# Patient Record
Sex: Female | Born: 1960 | Race: White | Hispanic: No | Marital: Married | Smoking: Never smoker
Health system: Southern US, Community
[De-identification: ages and names within clinical notes are randomized; demographics above are authoritative.]

## PROBLEM LIST (undated history)

## (undated) DIAGNOSIS — E079 Disorder of thyroid, unspecified: Secondary | ICD-10-CM

## (undated) DIAGNOSIS — E785 Hyperlipidemia, unspecified: Secondary | ICD-10-CM

## (undated) DIAGNOSIS — F32A Depression, unspecified: Secondary | ICD-10-CM

## (undated) DIAGNOSIS — F329 Major depressive disorder, single episode, unspecified: Secondary | ICD-10-CM

## (undated) DIAGNOSIS — T7840XA Allergy, unspecified, initial encounter: Secondary | ICD-10-CM

## (undated) HISTORY — DX: Depression, unspecified: F32.A

## (undated) HISTORY — DX: Allergy, unspecified, initial encounter: T78.40XA

## (undated) HISTORY — DX: Disorder of thyroid, unspecified: E07.9

## (undated) HISTORY — DX: Hyperlipidemia, unspecified: E78.5

## (undated) HISTORY — DX: Major depressive disorder, single episode, unspecified: F32.9

---

## 2004-07-15 DIAGNOSIS — F319 Bipolar disorder, unspecified: Secondary | ICD-10-CM | POA: Insufficient documentation

## 2004-09-23 ENCOUNTER — Other Ambulatory Visit: Payer: Self-pay

## 2004-09-23 ENCOUNTER — Emergency Department: Payer: Self-pay | Admitting: General Practice

## 2005-06-23 ENCOUNTER — Emergency Department: Payer: Self-pay | Admitting: Emergency Medicine

## 2005-09-29 ENCOUNTER — Ambulatory Visit: Payer: Self-pay | Admitting: Family Medicine

## 2006-07-20 ENCOUNTER — Inpatient Hospital Stay: Payer: Self-pay | Admitting: Unknown Physician Specialty

## 2008-04-24 ENCOUNTER — Ambulatory Visit: Payer: Self-pay | Admitting: Family Medicine

## 2009-01-24 DIAGNOSIS — E559 Vitamin D deficiency, unspecified: Secondary | ICD-10-CM | POA: Insufficient documentation

## 2009-01-30 DIAGNOSIS — E039 Hypothyroidism, unspecified: Secondary | ICD-10-CM | POA: Insufficient documentation

## 2012-03-23 LAB — HM PAP SMEAR: HM Pap smear: NEGATIVE

## 2012-05-04 ENCOUNTER — Ambulatory Visit: Payer: Self-pay | Admitting: Family Medicine

## 2012-05-17 ENCOUNTER — Ambulatory Visit: Payer: Self-pay | Admitting: Family Medicine

## 2012-07-05 IMAGING — MG MAM DGTL SCRN MAM NO ORDER W/CAD
1 series · 4 of 4 positions shown · non-contrast
Comparison: none

REASON FOR EXAM: scr mammo no order
COMMENTS:

[Series 8184: R CC · right · 4 of 4 slices shown]
[im 1/4]
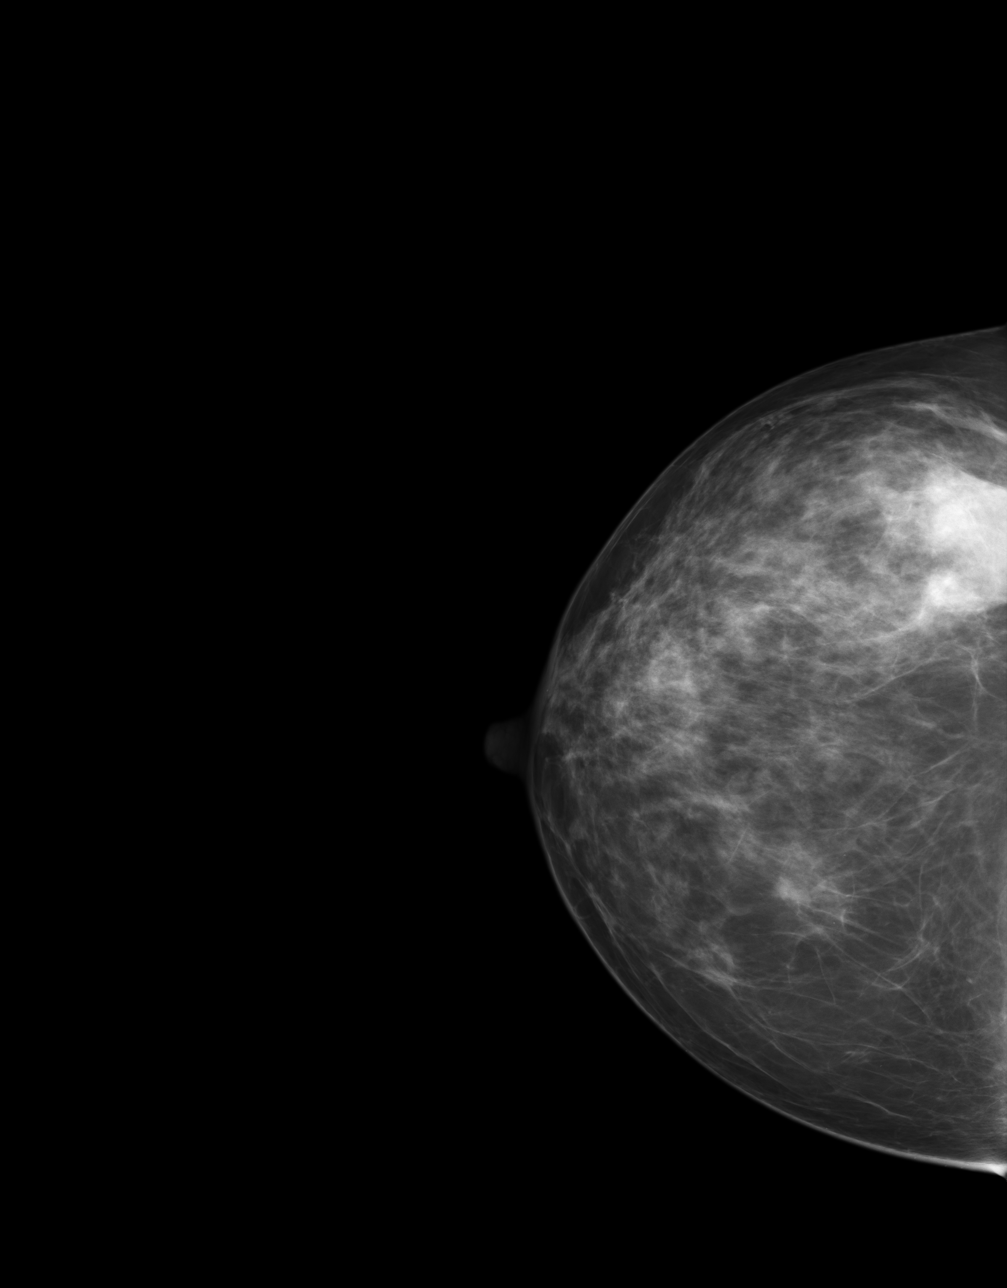
[im 2/4]
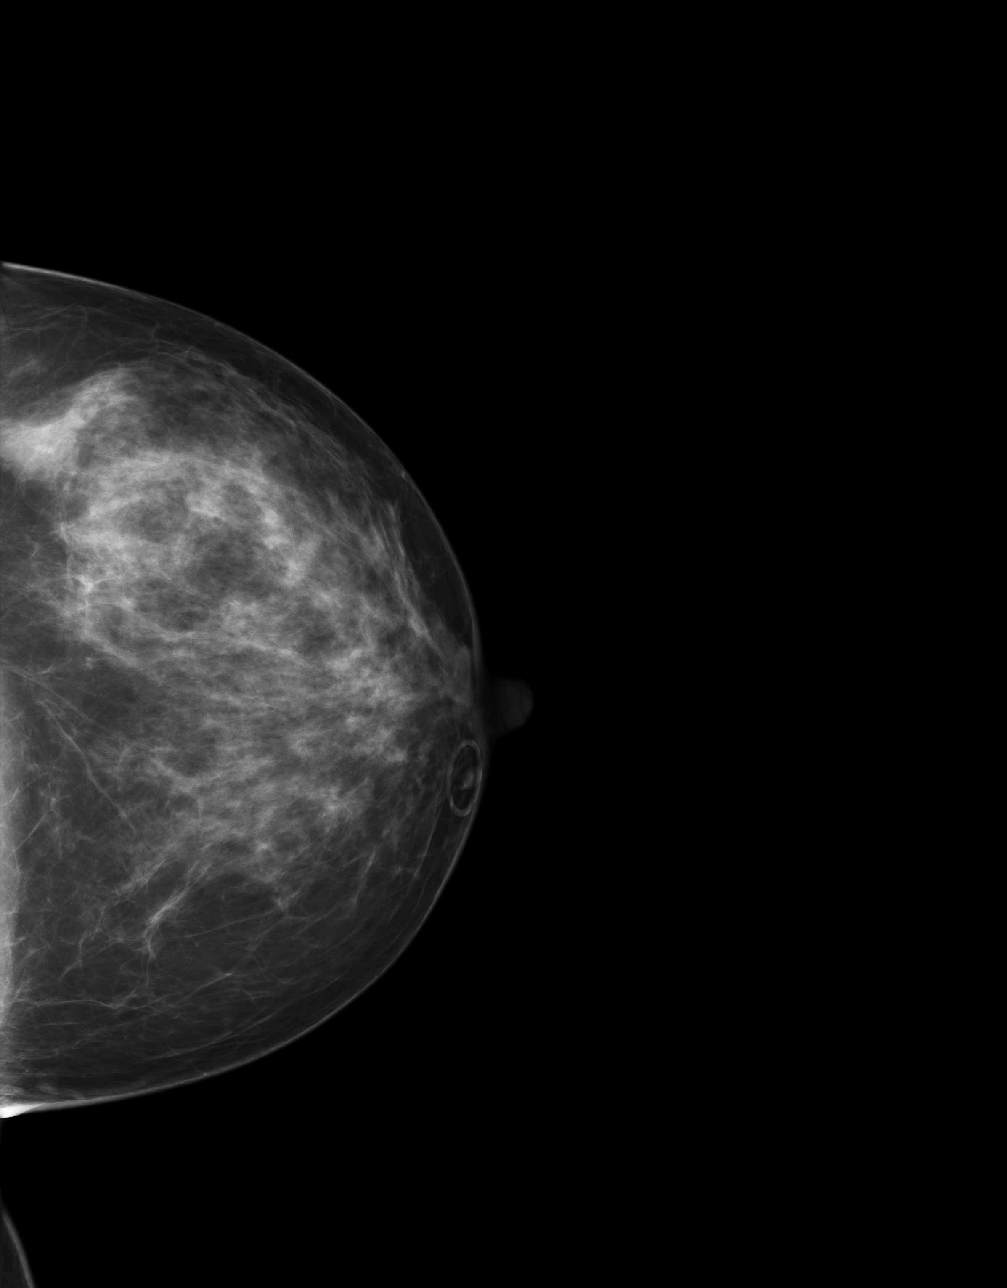
[im 3/4]
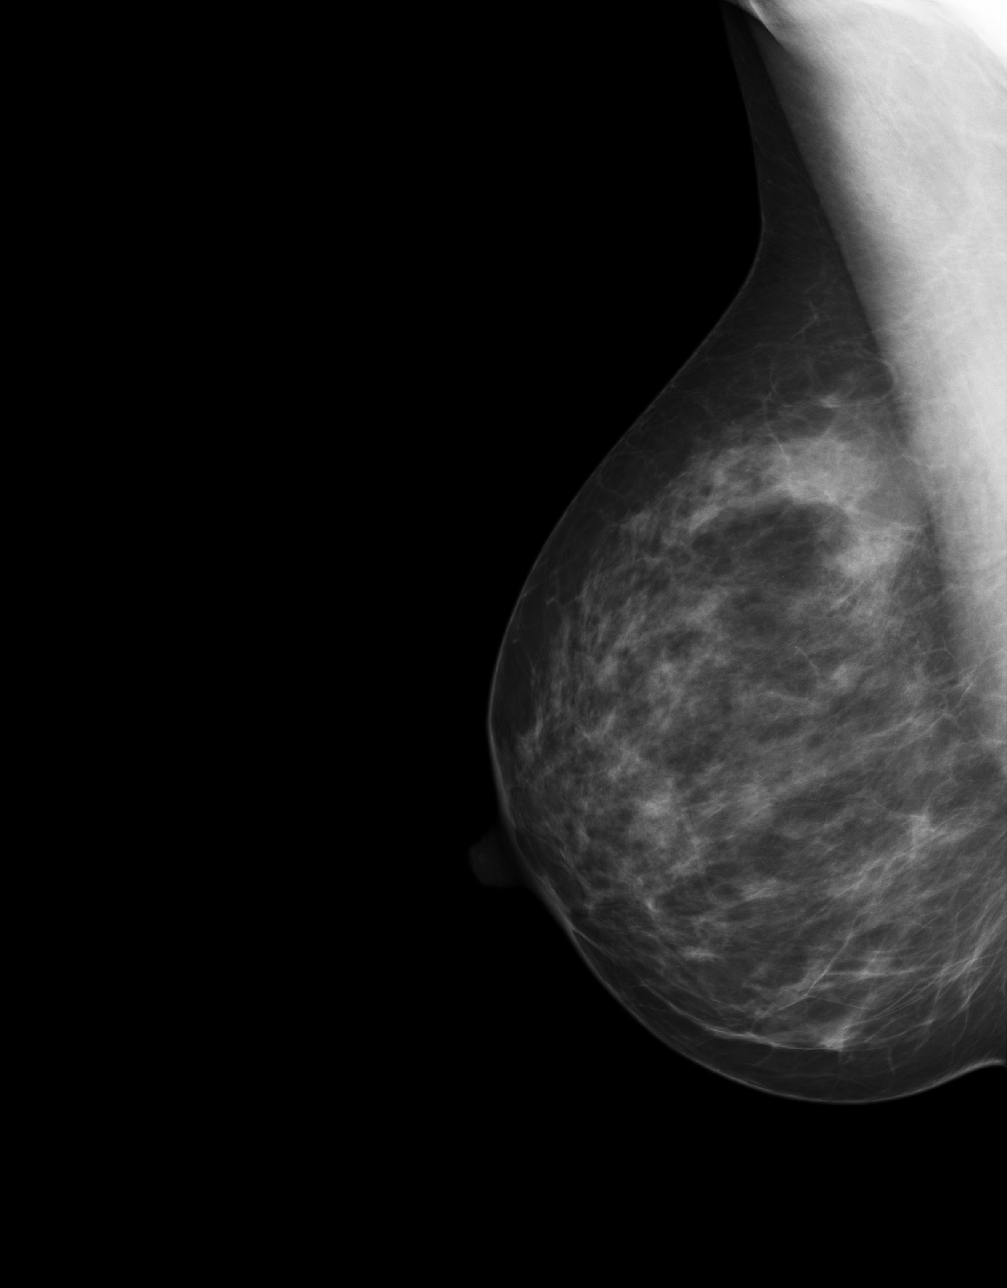
[im 4/4]
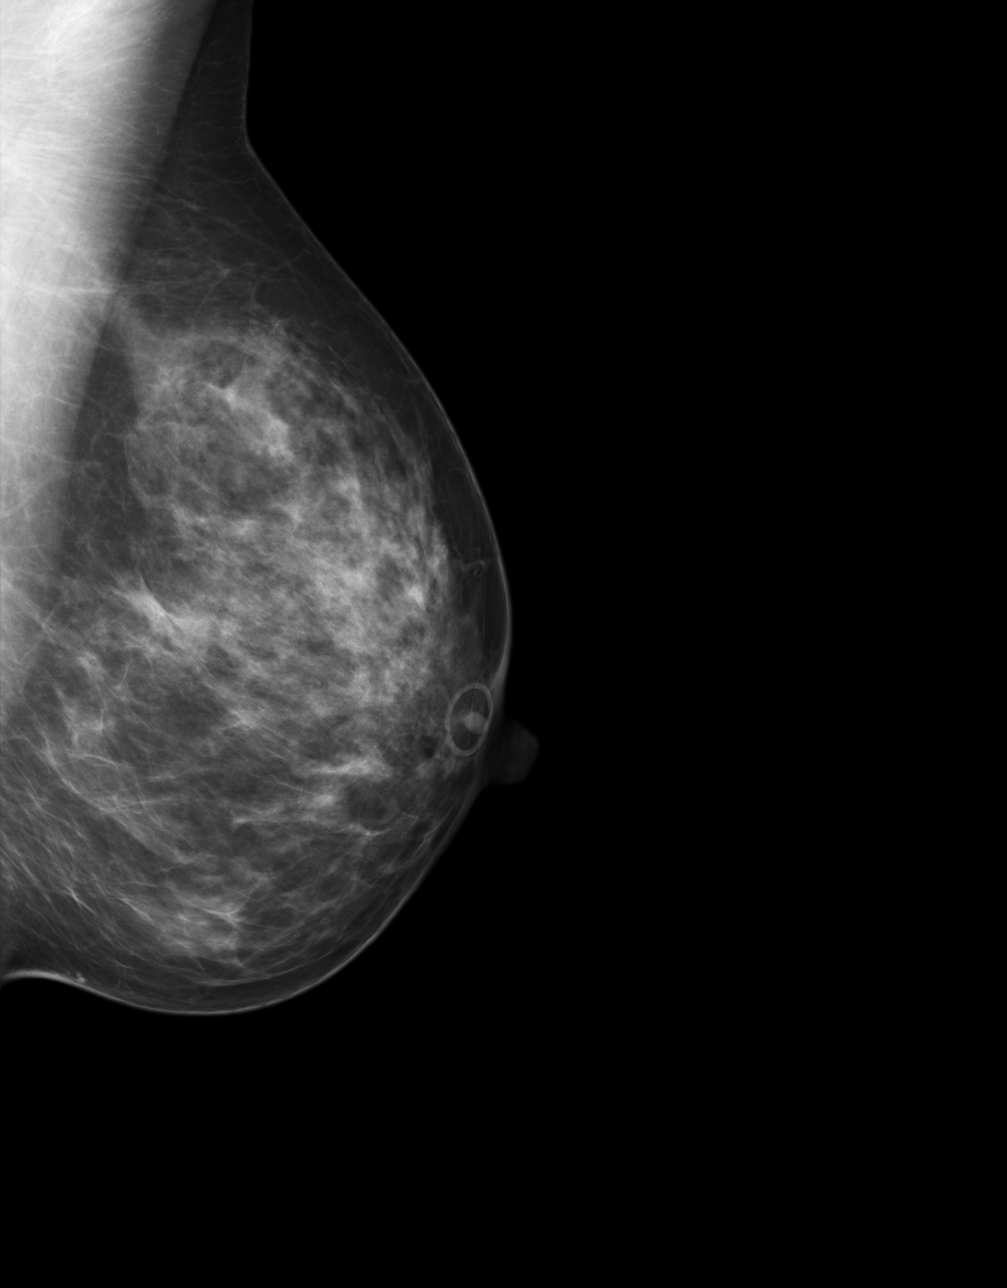

[4 of 4 positions shown; findings below may reference images not displayed]

PROCEDURE:     MAM - MAM DGTL SCRN MAM NO ORDER W/CAD  - May 04, 2012  [DATE]

RESULT:     There is a family history of breast cancer in the patient's
mother. The patient has no previous breast surgery. Comparison is made to
digital mammographic images 24 April, 2008, as well as 29 September, 2005 and
16 July, 2004. There is a moderately dense to dense heterogeneous
parenchymal pattern. There appears to be increasing parenchymal density in
the upper outer posterior right breast. Additional magnification compression
images of the area are necessary for further investigation. Ultrasound may
be required for complete evaluation. Otherwise, the pattern appears to be
grossly normal. No malignant calcification or architectural distortion is
seen.
IMPRESSION: BI-RADS: Category 0 - Needs Addtional Imaging Evaluation

Please have the patient return for additional magnification compression
images in the upper outer posterior right breast. The area is marked and
saved electronically in the digital archive.

A NEGATIVE MAMMOGRAM REPORT DOES NOT PRECLUDE BIOPSY OR OTHER EVALUATION OF
A CLINICALLY PALPABLE OR OTHERWISE SUSPICIOUS MASS OR LESION. BREAST CANCER
MAY NOT BE DETECTED BY MAMMOGRAPHY IN UP TO 10% OF CASES.

[REDACTED]

## 2014-09-19 LAB — HM MAMMOGRAPHY

## 2014-12-05 LAB — LIPID PANEL
CHOLESTEROL: 263 mg/dL — AB (ref 0–200)
HDL: 90 mg/dL — AB (ref 35–70)
LDL Cholesterol: 158 mg/dL
TRIGLYCERIDES: 73 mg/dL (ref 40–160)

## 2014-12-05 LAB — BASIC METABOLIC PANEL
BUN: 15 mg/dL (ref 4–21)
CREATININE: 1 mg/dL (ref 0.5–1.1)
Glucose: 86 mg/dL
Potassium: 4.5 mmol/L (ref 3.4–5.3)
Sodium: 141 mmol/L (ref 137–147)

## 2014-12-05 LAB — HEPATIC FUNCTION PANEL
ALT: 9 U/L (ref 7–35)
AST: 18 U/L (ref 13–35)

## 2014-12-05 LAB — CBC AND DIFFERENTIAL
HEMATOCRIT: 43 % (ref 36–46)
HEMOGLOBIN: 14.4 g/dL (ref 12.0–16.0)
Platelets: 235 10*3/uL (ref 150–399)
WBC: 4.4 10*3/mL

## 2014-12-05 LAB — TSH: TSH: 5.9 u[IU]/mL (ref 0.41–5.90)

## 2015-02-05 ENCOUNTER — Ambulatory Visit: Payer: Self-pay | Admitting: Gastroenterology

## 2015-02-05 LAB — HM COLONOSCOPY: HM Colonoscopy: 1

## 2015-02-07 ENCOUNTER — Ambulatory Visit: Payer: Self-pay | Admitting: Gastroenterology

## 2015-03-18 LAB — SURGICAL PATHOLOGY

## 2015-07-12 DIAGNOSIS — M2550 Pain in unspecified joint: Secondary | ICD-10-CM | POA: Insufficient documentation

## 2015-07-12 DIAGNOSIS — F32A Depression, unspecified: Secondary | ICD-10-CM | POA: Insufficient documentation

## 2015-07-12 DIAGNOSIS — E871 Hypo-osmolality and hyponatremia: Secondary | ICD-10-CM | POA: Insufficient documentation

## 2015-07-12 DIAGNOSIS — F329 Major depressive disorder, single episode, unspecified: Secondary | ICD-10-CM | POA: Insufficient documentation

## 2015-07-12 DIAGNOSIS — E785 Hyperlipidemia, unspecified: Secondary | ICD-10-CM | POA: Insufficient documentation

## 2015-07-12 DIAGNOSIS — B351 Tinea unguium: Secondary | ICD-10-CM | POA: Insufficient documentation

## 2015-07-12 DIAGNOSIS — H6120 Impacted cerumen, unspecified ear: Secondary | ICD-10-CM | POA: Insufficient documentation

## 2015-07-12 DIAGNOSIS — J309 Allergic rhinitis, unspecified: Secondary | ICD-10-CM | POA: Insufficient documentation

## 2015-07-12 DIAGNOSIS — G44209 Tension-type headache, unspecified, not intractable: Secondary | ICD-10-CM | POA: Insufficient documentation

## 2015-07-22 ENCOUNTER — Encounter: Payer: Self-pay | Admitting: Family Medicine

## 2015-07-22 ENCOUNTER — Ambulatory Visit (INDEPENDENT_AMBULATORY_CARE_PROVIDER_SITE_OTHER): Payer: BLUE CROSS/BLUE SHIELD | Admitting: Family Medicine

## 2015-07-22 VITALS — BP 118/76 | HR 88 | Temp 97.9°F | Resp 16 | Ht 67.5 in | Wt 143.0 lb

## 2015-07-22 DIAGNOSIS — R202 Paresthesia of skin: Secondary | ICD-10-CM | POA: Diagnosis not present

## 2015-07-22 DIAGNOSIS — E038 Other specified hypothyroidism: Secondary | ICD-10-CM

## 2015-07-22 DIAGNOSIS — E871 Hypo-osmolality and hyponatremia: Secondary | ICD-10-CM | POA: Diagnosis not present

## 2015-07-22 NOTE — Progress Notes (Signed)
Subjective:    Patient ID: Sherry Hammond, female    DOB: 11/15/1961, 54 y.o.   MRN: [id number]  HPI  Joint/Muscle Pain: Patient complains of myalgias for which has been present for several years. Pain is located in multiple joints and multiple muscles, is described as tingling, and is intermittent .  Associated symptoms include: decreased range of motion.  The patient has tried Tylenol and Advil.  Related to injury:   Pt is a former gymnast, and believes the aches and pains are secondary to that. Pt has noticed tingling in her legs for the last 6-12 months.  Has looked up fibromyalgia.   Does not see the hypomania. Does not think she has mania. Think she does not have it. Does have tingles all over her body. Is working with psychologist and psychiatrist. Has been on and off the Lithium. Did have some twitching. Was more level but it was too much.  Not sleeping well. Wakes and can not fall back to sleep. Starts thinking about things.  No grandiosity. Does have a lot of ADD characteristics.  Feeds her adrenaline in up.     Review of Systems  Constitutional: Positive for fatigue. Negative for fever, chills, diaphoresis, activity change, appetite change and unexpected weight change.  Endocrine: Positive for cold intolerance.  Neurological: Positive for weakness and numbness.  Psychiatric/Behavioral: Positive for sleep disturbance and decreased concentration. The patient is nervous/anxious.    BP 118/76 mmHg  Pulse 88  Temp(Src) 97.9 F (36.6 C) (Oral)  Resp 16  Ht 5' 7.5" (1.715 m)  Wt 143 lb (64.864 kg)  BMI 22.05 kg/m2   Patient Active Problem List   Diagnosis Date Noted  . Allergic rhinitis 07/12/2015  . Ache in joint 07/12/2015  . Cerumen impaction 07/12/2015  . Clinical depression 07/12/2015  . Hypercholesteremia 07/12/2015  . Hypo-osmolality and hyponatremia 07/12/2015  . Fungal infection of nail 07/12/2015  . Headache, tension-type 07/12/2015  . Adult hypothyroidism  01/30/2009  . Avitaminosis D 01/24/2009  . 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 Thyroid disease   . Depression   . Hyperlipidemia   . Allergy    Current Outpatient Prescriptions on File Prior to Visit  Medication Sig  . Cholecalciferol 1000 UNITS capsule Take 2 capsules by mouth daily.  Marland Kitchen levothyroxine (SYNTHROID) 88 MCG tablet Take 1 tablet by mouth daily.  Marland Kitchen zolpidem (AMBIEN) 10 MG tablet Take 1 tablet by mouth daily.   No current facility-administered medications on file prior to visit.   No Known Allergies Past Surgical History  Procedure Laterality Date  . Cesarean section     Social History   Social History  . Marital Status: Married    Spouse Name: N/A  . Number of Children: N/A  . Years of Education: N/A   Occupational History  . Not on file.   Social History Main Topics  . Smoking status: Never Smoker   . Smokeless tobacco: Never Used  . Alcohol Use: Yes     Comment: occasional  . Drug Use: No  . Sexual Activity: Yes   Other Topics Concern  . Not on file   Social History Narrative   Family History  Problem Relation Age of Onset  . Hyperlipidemia Mother   . Hypertension Mother   . Cancer Mother     BREAST  . Hyperlipidemia Father   . Hypertension Father   . Kidney disease Father   . Healthy Sister   .  Healthy Brother   . Healthy Sister       Objective:   Physical Exam  Constitutional: She is oriented to person, place, and time. She appears well-developed and well-nourished.  Neurological: She is alert and oriented to person, place, and time.  Psychiatric: She has a normal mood and affect. Her behavior is normal.  Speech a little pressured. Really fixated on dopamine levels.    BP 118/76 mmHg  Pulse 88  Temp(Src) 97.9 F (36.6 C) (Oral)  Resp 16  Ht 5' 7.5" (1.715 m)  Wt 143 lb (64.864 kg)  BMI 22.05 kg/m2     Assessment & Plan:  1. Paresthesia Worsening. Will check labs.  - Vitamin  B12  2. Other specified hypothyroidism Stable.  Will check labs.  - TSH  3. Hypo-osmolality and hyponatremia Stable. Will check labs. - CBC with Differential/Platelet - Comprehensive metabolic panel  Concerned that she does not want to have diagnosis of hypomania. Does not want a mood stabilizer.  Did spend a lot of time talking to patient about diagnosis.   Suspect she is hypomanic. Will follow thru with work up and reconsider medication.    Lorie Phenix, MD

## 2015-07-23 ENCOUNTER — Telehealth: Payer: Self-pay

## 2015-07-23 LAB — COMPREHENSIVE METABOLIC PANEL
ALT: 9 IU/L (ref 0–32)
AST: 17 IU/L (ref 0–40)
Albumin/Globulin Ratio: 1.8 (ref 1.1–2.5)
Albumin: 4.5 g/dL (ref 3.5–5.5)
Alkaline Phosphatase: 51 IU/L (ref 39–117)
BILIRUBIN TOTAL: 0.4 mg/dL (ref 0.0–1.2)
BUN/Creatinine Ratio: 20 (ref 9–23)
BUN: 18 mg/dL (ref 6–24)
CALCIUM: 10.1 mg/dL (ref 8.7–10.2)
CHLORIDE: 101 mmol/L (ref 97–108)
CO2: 24 mmol/L (ref 18–29)
Creatinine, Ser: 0.92 mg/dL (ref 0.57–1.00)
GFR calc non Af Amer: 72 mL/min/{1.73_m2} (ref >59)
GFR, EST AFRICAN AMERICAN: 83 mL/min/{1.73_m2} (ref >59)
GLUCOSE: 99 mg/dL (ref 65–99)
Globulin, Total: 2.5 g/dL (ref 1.5–4.5)
Potassium: 5.2 mmol/L (ref 3.5–5.2)
Sodium: 143 mmol/L (ref 134–144)
TOTAL PROTEIN: 7 g/dL (ref 6.0–8.5)

## 2015-07-23 LAB — CBC WITH DIFFERENTIAL/PLATELET
BASOS ABS: 0.1 10*3/uL (ref 0.0–0.2)
Basos: 1 %
EOS (ABSOLUTE): 0.2 10*3/uL (ref 0.0–0.4)
Eos: 4 %
Hematocrit: 41.5 % (ref 34.0–46.6)
Hemoglobin: 14.1 g/dL (ref 11.1–15.9)
IMMATURE GRANS (ABS): 0 10*3/uL (ref 0.0–0.1)
IMMATURE GRANULOCYTES: 0 %
LYMPHS: 35 %
Lymphocytes Absolute: 1.6 10*3/uL (ref 0.7–3.1)
MCH: 30.1 pg (ref 26.6–33.0)
MCHC: 34 g/dL (ref 31.5–35.7)
MCV: 89 fL (ref 79–97)
Monocytes Absolute: 0.3 10*3/uL (ref 0.1–0.9)
Monocytes: 8 %
NEUTROS PCT: 52 %
Neutrophils Absolute: 2.3 10*3/uL (ref 1.4–7.0)
PLATELETS: 243 10*3/uL (ref 150–379)
RBC: 4.68 x10E6/uL (ref 3.77–5.28)
RDW: 12.8 % (ref 12.3–15.4)
WBC: 4.5 10*3/uL (ref 3.4–10.8)

## 2015-07-23 LAB — TSH: TSH: 1.81 u[IU]/mL (ref 0.450–4.500)

## 2015-07-23 LAB — VITAMIN B12: VITAMIN B 12: 492 pg/mL (ref 211–946)

## 2015-07-23 NOTE — Telephone Encounter (Signed)
-----   Message from Lorie Phenix, MD sent at 07/23/2015  8:28 AM EDT ----- Labs normal. Please notify patient. Thanks.

## 2015-07-23 NOTE — Telephone Encounter (Signed)
Pt advised.   Thanks,   -Jazmyn Offner  

## 2015-07-23 NOTE — Telephone Encounter (Signed)
LMTCB 07/23/2015  Thanks,   -Laura  

## 2015-07-23 NOTE — Telephone Encounter (Signed)
-----   Message from Nancy Maloney, MD sent at 07/23/2015  8:28 AM EDT ----- Labs normal. Please notify patient. Thanks. 

## 2015-07-23 NOTE — Telephone Encounter (Signed)
LMTCB 07/23/2015  Thanks,   -Marsh Heckler  

## 2015-10-16 ENCOUNTER — Ambulatory Visit (INDEPENDENT_AMBULATORY_CARE_PROVIDER_SITE_OTHER): Payer: BLUE CROSS/BLUE SHIELD | Admitting: Family Medicine

## 2015-10-16 ENCOUNTER — Encounter: Payer: Self-pay | Admitting: Family Medicine

## 2015-10-16 VITALS — BP 102/66 | HR 80 | Temp 98.1°F | Resp 16 | Ht 67.0 in | Wt 150.0 lb

## 2015-10-16 DIAGNOSIS — Z0000 Encounter for general adult medical examination without abnormal findings: Secondary | ICD-10-CM

## 2015-10-16 DIAGNOSIS — Z1231 Encounter for screening mammogram for malignant neoplasm of breast: Secondary | ICD-10-CM

## 2015-10-16 DIAGNOSIS — Z126 Encounter for screening for malignant neoplasm of bladder: Secondary | ICD-10-CM | POA: Diagnosis not present

## 2015-10-16 LAB — POCT URINALYSIS DIPSTICK
BILIRUBIN UA: NEGATIVE
Glucose, UA: NEGATIVE
Ketones, UA: NEGATIVE
Leukocytes, UA: NEGATIVE
NITRITE UA: NEGATIVE
PH UA: 5
Protein, UA: NEGATIVE
RBC UA: NEGATIVE
Spec Grav, UA: >=1.03
UROBILINOGEN UA: 0.2

## 2015-10-16 NOTE — Progress Notes (Signed)
Patient: Sherry Hammond, Female    DOB: 11/06/1961, 54 y.o.   MRN: [id number] Visit Date: 10/16/2015  Today's Provider: Lorie PhenixNancy Shaunak Kreis, MD   Chief Complaint  Patient presents with  . Annual Exam   Subjective:    Annual physical exam Sherry Hammond is a 54 y.o. female who presents today for health maintenance and complete physical. She feels well. She reports exercising (no regular exercising). She reports she is sleeping fairly well.  Last CPE- 10/05/2014 Last Mam- 09/19/2014- BI-RADS 1 Last Pap- 03/23/2012- WNL- No HPV reflex checked Last Colonoscopy- 02/05/2015. Hyperplastic polyp. Dr. Servando SnareWohl -----------------------------------------------------------------   Review of Systems  Genitourinary: Positive for urgency.  Musculoskeletal: Positive for neck pain.  Neurological: Positive for headaches.  Psychiatric/Behavioral: Positive for sleep disturbance. The patient is nervous/anxious.   All other systems reviewed and are negative.   Social History She  reports that she has never smoked. She has never used smokeless tobacco. She reports that she drinks alcohol. She reports that she does not use illicit drugs. Social History   Social History  . Marital Status: Married    Spouse Name: Loistine Chancehilip  . Number of Children: 4  . Years of Education: Bachelor's   Occupational History  . Unemployed    Social History Main Topics  . Smoking status: Never Smoker   . Smokeless tobacco: Never Used  . Alcohol Use: Yes     Comment: occasional  . Drug Use: No  . Sexual Activity: Yes   Other Topics Concern  . None   Social History Narrative    Patient Active Problem List   Diagnosis Date Noted  . Paresthesia 07/22/2015  . Allergic rhinitis 07/12/2015  . Ache in joint 07/12/2015  . Cerumen impaction 07/12/2015  . Hyperlipidemia 07/12/2015  . Hypo-osmolality and hyponatremia 07/12/2015  . Fungal infection of nail 07/12/2015  . Headache, tension-type 07/12/2015  .  Adult hypothyroidism 01/30/2009  . Avitaminosis D 01/24/2009  . Affective bipolar disorder (HCC) 07/15/2004    Past Surgical History  Procedure Laterality Date  . Cesarean section      Family History  Family Status  Relation Status Death Age  . Mother Alive   . Father Alive   . Sister Alive   . Brother Alive   . Sister Alive    Her family history includes Cancer in her mother; Healthy in her brother, sister, and sister; Hyperlipidemia in her father and mother; Hypertension in her father and mother; Kidney disease in her father.    No Known Allergies  Previous Medications   BIOTIN PO    Take by mouth.   CHOLECALCIFEROL 1000 UNITS CAPSULE    Take 2 capsules by mouth daily.   LEVOTHYROXINE (SYNTHROID) 88 MCG TABLET    Take 1 tablet by mouth daily.   OMEGA-3 FATTY ACIDS (FISH OIL) 1200 MG CAPS    Take by mouth.    Patient Care Team: Lorie PhenixNancy Riyan Gavina, MD as PCP - General (Family Medicine)     Objective:   Vitals: BP 102/66 mmHg  Pulse 80  Temp(Src) 98.1 F (36.7 C) (Oral)  Resp 16  Ht 5\' 7"  (1.702 m)  Wt 150 lb (68.04 kg)  BMI 23.49 kg/m2   Physical Exam  Constitutional: She is oriented to person, place, and time. She appears well-developed and well-nourished.  HENT:  Head: Normocephalic and atraumatic.  Right Ear: Tympanic membrane, external ear and ear canal normal.  Left Ear: Tympanic membrane, external ear and ear canal  normal.  Nose: Nose normal.  Mouth/Throat: Uvula is midline, oropharynx is clear and moist and mucous membranes are normal.  Eyes: Conjunctivae, EOM and lids are normal. Pupils are equal, round, and reactive to light.  Neck: Trachea normal and normal range of motion. Neck supple. Carotid bruit is not present. No thyroid mass and no thyromegaly present.  Cardiovascular: Normal rate, regular rhythm and normal heart sounds.   Pulmonary/Chest: Effort normal and breath sounds normal.  Abdominal: Soft. Normal appearance and bowel sounds are normal. There  is no hepatosplenomegaly. There is no tenderness.  Genitourinary: No breast swelling, tenderness or discharge.  Musculoskeletal: Normal range of motion.  Lymphadenopathy:    She has no cervical adenopathy.    She has no axillary adenopathy.  Neurological: She is alert and oriented to person, place, and time. She has normal strength. No cranial nerve deficit.  Skin: Skin is warm, dry and intact.  Psychiatric: She has a normal mood and affect. Her speech is normal and behavior is normal. Judgment and thought content normal. Cognition and memory are normal.     Depression Screen PHQ 2/9 Scores 10/16/2015  PHQ - 2 Score 0      Assessment & Plan:     Routine Health Maintenance and Physical Exam  Exercise Activities and Dietary recommendations Goals    None      Immunization History  Administered Date(s) Administered  . Td 11/23/2000  . Tdap 10/05/2014    Health Maintenance  Topic Date Due  . Hepatitis C Screening  Apr 01, 1961  . HIV Screening  08/19/1978  . PAP SMEAR  03/24/2015  . INFLUENZA VACCINE  06/24/2015  . MAMMOGRAM  09/19/2016  . TETANUS/TDAP  10/05/2024  . COLONOSCOPY  02/04/2025      Discussed health benefits of physical activity, and encouraged her to engage in regular exercise appropriate for her age and condition.    --------------------------------------------------------------------  1. Annual physical exam Stable. Encouraged healthy lifestyle. FU for chronic problems as scheduled.  2. Encounter for screening mammogram for breast cancer FU pending results. - MM DIGITAL SCREENING BILATERAL; Future  3. Bladder cancer screening UA negative.  Results for orders placed or performed in visit on 10/16/15  POCT urinalysis dipstick  Result Value Ref Range   Color, UA yellow    Clarity, UA clear    Glucose, UA Negative    Bilirubin, UA Negative    Ketones, UA Negative    Spec Grav, UA >=1.030    Blood, UA Negative    pH, UA 5.0    Protein, UA  Negative    Urobilinogen, UA 0.2    Nitrite, UA Negative    Leukocytes, UA Negative Negative    - POCT urinalysis dipstick  Patient seen and examined by Leo Grosser, MD, and note scribed by Allene Dillon, CMA. I have reviewed the document for accuracy and completeness and I agree with above. Leo Grosser, MD   Lorie Phenix, MD

## 2016-03-31 ENCOUNTER — Other Ambulatory Visit: Payer: Self-pay | Admitting: Family Medicine

## 2016-03-31 MED ORDER — LEVOTHYROXINE SODIUM 88 MCG PO TABS: 88.0000 ug | ORAL_TABLET | Freq: Every day | ORAL | Status: DC

## 2016-03-31 NOTE — Telephone Encounter (Signed)
Pt contacted office for refill request on the following medications: levothyroxine (SYNTHROID) 88 MCG tablet. Pt would like 90 day supply with [facility]. I verified the pharmacy with pt and she stated that her [employer] [facility] uses [facility]. Please advise. Thanks TNP

## 2016-03-31 NOTE — Telephone Encounter (Signed)
Patient was last seen on 10/16/2015 for CPE. Patient is requesting 90 day supply to be sent into mail order pharmacy.

## 2016-08-19 ENCOUNTER — Encounter: Payer: Self-pay | Admitting: Physician Assistant

## 2016-08-19 ENCOUNTER — Ambulatory Visit (INDEPENDENT_AMBULATORY_CARE_PROVIDER_SITE_OTHER): Payer: BLUE CROSS/BLUE SHIELD | Admitting: Physician Assistant

## 2016-08-19 VITALS — BP 120/70 | HR 88 | Temp 98.0°F | Resp 16 | Ht 67.0 in | Wt 154.0 lb

## 2016-08-19 DIAGNOSIS — E038 Other specified hypothyroidism: Secondary | ICD-10-CM

## 2016-08-19 DIAGNOSIS — F902 Attention-deficit hyperactivity disorder, combined type: Secondary | ICD-10-CM | POA: Diagnosis not present

## 2016-08-19 DIAGNOSIS — E785 Hyperlipidemia, unspecified: Secondary | ICD-10-CM | POA: Diagnosis not present

## 2016-08-19 MED ORDER — METHYLPHENIDATE HCL 20 MG PO TABS: 20.0000 mg | ORAL_TABLET | Freq: Two times a day (BID) | ORAL | 0 refills | Status: DC

## 2016-08-19 NOTE — Progress Notes (Signed)
Patient: Sherry Hammond Female    DOB: 26-Oct-1961   55 y.o.   MRN: [id number] Visit Date: 08/19/2016  Today's Provider: Margaretann LovelessJennifer M Burnette, PA-C   Chief Complaint  Patient presents with  . Hypothyroidism  . Hyperlipidemia  . ADHD   Subjective:    HPI  Hypothyroid, follow-up:  TSH  Date Value Ref Range Status  07/22/2015 1.810 0.450 - 4.500 uIU/mL Final  12/05/2014 5.90 0.41 - 5.90 uIU/mL Final   Wt Readings from Last 3 Encounters:  08/19/16 154 lb (69.9 kg)  10/16/15 150 lb (68 kg)  07/22/15 143 lb (64.9 kg)    She was last seen for hypothyroid 1 years ago.  Management since that visit includes check labs. She reports excellent compliance with treatment. She is not having side effects.  She is exercising. She is experiencing heat / cold intolerance She denies change in energy level Weight trend: stable  ------------------------------------------------------------------------   Lipid/Cholesterol, Follow-up:   Last seen for this over a 1 years ago.  Management changes since that visit include check labs. . Last Lipid Panel:    Component Value Date/Time   CHOL 263 (A) 12/05/2014   TRIG 73 12/05/2014   HDL 90 (A) 12/05/2014   LDLCALC 158 12/05/2014    Risk factors for vascular disease include hypercholesterolemia  She reports excellent compliance with treatment. She is not having side effects.  Current symptoms include none and have been stable. Weight trend: stable Prior visit with dietician: no Current diet: in general, a "healthy" diet   Current exercise: walking  Wt Readings from Last 3 Encounters:  08/19/16 154 lb (69.9 kg)  10/16/15 150 lb (68 kg)  07/22/15 143 lb (64.9 kg)    -------------------------------------------------------------------   Follow up for ADHD  The patient was last seen for this 1 years ago. Patient reports that she was been seen by Dr. Maryruth BunKapur. Patient reports that in the past she has been treated with ADHD  medication. Patient reports that she has been off medication for about 1 year. She has been evaluated for ADHD in 2016 and these notes are in Epic. She had been treated with Concerta successfully initially, but insurance stopped covering the ER methylphenidate. She was then placed on Adderall 20mg  and reports not much change in symptoms. She then discontinued on her own and has been maintaining. She is interested in restarting medication to be able to help her 589 yr old son with school work (He is also ADHD).   --------------------------------------------------------------------------------    No Known Allergies   Current Outpatient Prescriptions:  .  Cholecalciferol 1000 UNITS capsule, Take 2 capsules by mouth daily., Disp: , Rfl:  .  levothyroxine (SYNTHROID) 88 MCG tablet, Take 1 tablet (88 mcg total) by mouth daily., Disp: 90 tablet, Rfl: 3  Review of Systems  Constitutional: Negative.   Respiratory: Negative.   Cardiovascular: Negative.   Gastrointestinal: Negative.   Endocrine: Negative.   Neurological: Negative.   Psychiatric/Behavioral: Positive for decreased concentration. Negative for agitation, behavioral problems, dysphoric mood, self-injury, sleep disturbance and suicidal ideas. The patient is nervous/anxious and is hyperactive.     Social History  Substance Use Topics  . Smoking status: Never Smoker  . Smokeless tobacco: Never Used  . Alcohol use Yes     Comment: occasional   Objective:   BP 120/70 (BP Location: Left Arm, Patient Position: Sitting, Cuff Size: Large)   Pulse 88   Temp 98 F (36.7 C) (Oral)  Resp 16   Ht 5\' 7"  (1.702 m)   Wt 154 lb (69.9 kg)   BMI 24.12 kg/m   Physical Exam  Constitutional: She appears well-developed and well-nourished. No distress.  Neck: Normal range of motion. Neck supple. No tracheal deviation present. No thyromegaly present.  Cardiovascular: Normal rate, regular rhythm and normal heart sounds.  Exam reveals no gallop and no  friction rub.   No murmur heard. Pulmonary/Chest: Effort normal and breath sounds normal. No respiratory distress. She has no wheezes. She has no rales.  Musculoskeletal: She exhibits no edema.  Lymphadenopathy:    She has no cervical adenopathy.  Skin: She is not diaphoretic.  Psychiatric: She has a normal mood and affect. Her behavior is normal. Judgment and thought content normal. Her speech is rapid and/or pressured. Cognition and memory are normal.  Vitals reviewed.     Assessment & Plan:     1. Attention deficit hyperactivity disorder (ADHD), combined type Will start ritalin as below since concerta had worked well for her but ER is not covered by her insurance. She is to call if she notices this dose is too high or too low. I will see her back in Nov for her CPE. - methylphenidate (RITALIN) 20 MG tablet; Take 1 tablet (20 mg total) by mouth 2 (two) times daily.  Dispense: 60 tablet; Refill: 0  2. Other specified hypothyroidism Stable on levothyroxine . Continue current medical treatment. Will see her back in Nov for CPE and check labs at that time.   3. Hyperlipidemia Stable. Good HDL. Diet controlled. Will check labs in Nov when she returns for CPE.        Margaretann Loveless, PA-C  [facility]

## 2016-08-19 NOTE — Patient Instructions (Signed)
Methylphenidate tablets What is this medicine? METHYLPHENIDATE (meth il FEN i date) is used to treat attention-deficit hyperactivity disorder (ADHD). It is also used to treat narcolepsy. This medicine may be used for other purposes; ask your health care provider or pharmacist if you have questions. What should I tell my health care provider before I take this medicine? They need to know if you have any of these conditions: -anxiety or panic attacks -circulation problems in fingers and toes -glaucoma -hardening or blockages of the arteries or heart blood vessels -heart disease or a heart defect -high blood pressure -history of a drug or alcohol abuse problem -history of stroke -liver disease -mental illness -motor tics, family history or diagnosis of Tourette's syndrome -seizures -suicidal thoughts, plans, or attempt; a previous suicide attempt by you or a family member -thyroid disease -an unusual or allergic reaction to methylphenidate, other medicines, foods, dyes, or preservatives -pregnant or trying to get pregnant -breast-feeding How should I use this medicine? Take this medicine by mouth with a glass of water. Follow the directions on the prescription label. It is best to take this medicine 30 to 45 minutes before meals, unless your doctor tells you otherwise. Take your medicine at regular intervals. Usually the last dose of the day will be taken at least 4 to 6 hours before bedtime, so it will not interfere with sleep. Do not take your medicine more often than directed. A special MedGuide will be given to you by the pharmacist with each prescription and refill. Be sure to read this information carefully each time. Talk to your pediatrician regarding the use of this medicine in children. While this drug may be prescribed for children as young as 6 years of age for selected conditions, precautions do apply. Overdosage: If you think you have taken too much of this medicine contact a  poison control center or emergency room at once. NOTE: This medicine is only for you. Do not share this medicine with others. What if I miss a dose? If you miss a dose, take it as soon as you can. If it is almost time for your next dose, take only that dose. Do not take double or extra doses. What may interact with this medicine? Do not take this medicine with any of the following medications: -lithium -MAOIs like Carbex, Eldepryl, Marplan, Nardil, and Parnate -other stimulant medicines for attention disorders, weight loss, or to stay awake -procarbazine This medicine may also interact with the following medications: -atomoxetine -caffeine -certain medicines for blood pressure, heart disease, irregular heart beat -certain medicines for depression, anxiety, or psychotic disturbances -certain medicines for seizures like carbamazepine, phenobarbital, phenytoin -cold or allergy medicines -warfarin This list may not describe all possible interactions. Give your health care provider a list of all the medicines, herbs, non-prescription drugs, or dietary supplements you use. Also tell them if you smoke, drink alcohol, or use illegal drugs. Some items may interact with your medicine. What should I watch for while using this medicine? Visit your doctor or health care professional for regular checks on your progress. This prescription requires that you follow special procedures with your doctor and pharmacy. You will need to have a new written prescription from your doctor or health care professional every time you need a refill. This medicine may affect your concentration, or hide signs of tiredness. Until you know how this drug affects you, do not drive, ride a bicycle, use machinery, or do anything that needs mental alertness. Tell your doctor or health   care professional if this medicine loses its effects, or if you feel you need to take more than the prescribed amount. Do not change the dosage without  talking to your doctor or health care professional. For males, contact your doctor or health care professional right away if you have an erection that lasts longer than 4 hours or if it becomes painful. This may be a sign of a serious problem and must be treated right away to prevent permanent damage. Decreased appetite is a common side effect when starting this medicine. Eating small, frequent meals or snacks can help. Talk to your doctor if you continue to have poor eating habits. Height and weight growth of a child taking this medicine will be monitored closely. Do not take this medicine close to bedtime. It may prevent you from sleeping. If you are going to need surgery, a MRI, CT scan, or other procedure, tell your doctor that you are taking this medicine. You may need to stop taking this medicine before the procedure. Tell your doctor or healthcare professional right away if you notice unexplained wounds on your fingers and toes while taking this medicine. You should also tell your healthcare provider if you experience numbness or pain, changes in the skin color, or sensitivity to temperature in your fingers or toes. What side effects may I notice from receiving this medicine? Side effects that you should report to your doctor or health care professional as soon as possible: -allergic reactions like skin rash, itching or hives, swelling of the face, lips, or tongue -changes in vision -chest pain or chest tightness -fast, irregular heartbeat -fingers or toes feel numb, cool, painful -hallucination, loss of contact with reality -high blood pressure -males: prolonged or painful erection -seizures -severe headaches -shortness of breath -suicidal thoughts or other mood changes -trouble walking, dizziness, loss of balance or coordination -uncontrollable head, mouth, neck, arm, or leg movements -unusual bleeding or bruising Side effects that usually do not require medical attention (report to  your doctor or health care professional if they continue or are bothersome): -anxious -headache -loss of appetite -nausea, vomiting -trouble sleeping -weight loss This list may not describe all possible side effects. Call your doctor for medical advice about side effects. You may report side effects to FDA at 1-800-FDA-1088. Where should I keep my medicine? Keep out of the reach of children. This medicine can be abused. Keep your medicine in a safe place to protect it from theft. Do not share this medicine with anyone. Selling or giving away this medicine is dangerous and against the law. This medicine may cause accidental overdose and death if taken by other adults, children, or pets. Mix any unused medicine with a substance like cat litter or coffee grounds. Then throw the medicine away in a sealed container like a sealed bag or a coffee can with a lid. Do not use the medicine after the expiration date. Store at room temperature between 15 and 30 degrees C (59 and 86 degrees F). Protect from light and moisture. Keep container tightly closed. NOTE: This sheet is a summary. It may not cover all possible information. If you have questions about this medicine, talk to your doctor, pharmacist, or health care provider.    2016, Elsevier/Gold Standard. (2014-07-31 15:33:34)  

## 2016-09-22 ENCOUNTER — Other Ambulatory Visit: Payer: Self-pay | Admitting: Physician Assistant

## 2016-09-22 DIAGNOSIS — F902 Attention-deficit hyperactivity disorder, combined type: Secondary | ICD-10-CM

## 2016-09-22 NOTE — Telephone Encounter (Signed)
Pt contacted office for refill request on the following medications:  methylphenidate (RITALIN) 20 MG tablet.  90 day supply.  [facility].  CB#[phone]/MW

## 2016-09-23 MED ORDER — METHYLPHENIDATE HCL 20 MG PO TABS: 20.0000 mg | ORAL_TABLET | Freq: Two times a day (BID) | ORAL | 0 refills | Status: DC

## 2016-09-23 NOTE — Telephone Encounter (Signed)
Ritalin refilled. [facility] to [facility]

## 2016-10-20 ENCOUNTER — Encounter: Payer: BLUE CROSS/BLUE SHIELD | Admitting: Physician Assistant

## 2016-10-22 ENCOUNTER — Encounter: Payer: Self-pay | Admitting: Physician Assistant

## 2016-10-22 ENCOUNTER — Ambulatory Visit (INDEPENDENT_AMBULATORY_CARE_PROVIDER_SITE_OTHER): Payer: BLUE CROSS/BLUE SHIELD | Admitting: Physician Assistant

## 2016-10-22 VITALS — Temp 98.3°F | Resp 16 | Ht 67.0 in | Wt 153.0 lb

## 2016-10-22 DIAGNOSIS — Z1159 Encounter for screening for other viral diseases: Secondary | ICD-10-CM

## 2016-10-22 DIAGNOSIS — Z124 Encounter for screening for malignant neoplasm of cervix: Secondary | ICD-10-CM

## 2016-10-22 DIAGNOSIS — Z8249 Family history of ischemic heart disease and other diseases of the circulatory system: Secondary | ICD-10-CM | POA: Diagnosis not present

## 2016-10-22 DIAGNOSIS — F419 Anxiety disorder, unspecified: Secondary | ICD-10-CM

## 2016-10-22 DIAGNOSIS — Z114 Encounter for screening for human immunodeficiency virus [HIV]: Secondary | ICD-10-CM

## 2016-10-22 DIAGNOSIS — Z13220 Encounter for screening for lipoid disorders: Secondary | ICD-10-CM | POA: Diagnosis not present

## 2016-10-22 DIAGNOSIS — Z0000 Encounter for general adult medical examination without abnormal findings: Secondary | ICD-10-CM | POA: Diagnosis not present

## 2016-10-22 DIAGNOSIS — Z1231 Encounter for screening mammogram for malignant neoplasm of breast: Secondary | ICD-10-CM

## 2016-10-22 DIAGNOSIS — Z136 Encounter for screening for cardiovascular disorders: Secondary | ICD-10-CM

## 2016-10-22 DIAGNOSIS — Z1239 Encounter for other screening for malignant neoplasm of breast: Secondary | ICD-10-CM

## 2016-10-22 MED ORDER — BUPROPION HCL ER (XL) 150 MG PO TB24: 150.0000 mg | ORAL_TABLET | Freq: Every day | ORAL | 0 refills | Status: DC

## 2016-10-22 NOTE — Progress Notes (Signed)
Patient: Sherry Hammond, Female    DOB: 03/21/1961, 55 y.o.   MRN: [id number] Visit Date: 10/22/2016  Today's Provider: Margaretann LovelessJennifer M Burnette, PA-C   Chief Complaint  Patient presents with  . Annual Exam   Subjective:    Annual physical exam Sherry Hammond is a 55 y.o. female who presents today for health maintenance and complete physical. She feels well. She reports exercising not regularly. She reports she is sleeping fairly well.   Review of Systems  Constitutional: Negative.   HENT: Negative.   Eyes: Negative.   Respiratory: Negative.   Cardiovascular: Negative.   Gastrointestinal: Negative.   Endocrine: Negative.   Genitourinary: Negative.   Musculoskeletal: Negative.   Skin: Negative.   Allergic/Immunologic: Negative.   Neurological: Negative.   Hematological: Negative.   Psychiatric/Behavioral: The patient is nervous/anxious.     Social History      She  reports that she has never smoked. She has never used smokeless tobacco. She reports that she drinks alcohol. She reports that she does not use drugs.       Social History   Social History  . Marital status: Married    Spouse name: Loistine Chancehilip  . Number of children: 4  . Years of education: Bachelor's   Occupational History  . Unemployed    Social History Main Topics  . Smoking status: Never Smoker  . Smokeless tobacco: Never Used  . Alcohol use Yes     Comment: occasional  . Drug use: No  . Sexual activity: Yes   Other Topics Concern  . None   Social History Narrative  . None    Past Medical History:  Diagnosis Date  . Allergy   . Depression   . Hyperlipidemia   . Thyroid disease      Patient Active Problem List   Diagnosis Date Noted  . Paresthesia 07/22/2015  . Allergic rhinitis 07/12/2015  . Ache in joint 07/12/2015  . Cerumen impaction 07/12/2015  . Hyperlipidemia 07/12/2015  . Hypo-osmolality and hyponatremia 07/12/2015  . Fungal infection of nail 07/12/2015  .  Headache, tension-type 07/12/2015  . Adult hypothyroidism 01/30/2009  . Avitaminosis D 01/24/2009  . 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 CESAREAN SECTION      Family History        Family Status  Relation Status  . Mother Alive  . Father Alive  . Sister Alive  . Brother Alive  . Sister Alive        Her family history includes Cancer in her mother; Healthy in her brother, sister, and sister; Hyperlipidemia in her father and mother; Hypertension in her father and mother; Kidney disease in her father.     No Known Allergies   Current Outpatient Prescriptions:  .  Cholecalciferol 1000 UNITS capsule, Take 2 capsules by mouth daily., Disp: , Rfl:  .  levothyroxine (SYNTHROID) 88 MCG tablet, Take 1 tablet (88 mcg total) by mouth daily., Disp: 90 tablet, Rfl: 3 .  methylphenidate (RITALIN) 20 MG tablet, Take 1 tablet (20 mg total) by mouth 2 (two) times daily., Disp: 180 tablet, Rfl: 0   Patient Care Team: Margaretann LovelessJennifer M Burnette, PA-C as PCP - General (Family Medicine)      Objective:   Vitals: Temp 98.3 F (36.8 C)   Resp 16   Ht 5\' 7"  (1.702 m)   Wt 153 lb (69.4 kg)   BMI 23.96  kg/m    Physical Exam  Constitutional: She is oriented to person, place, and time. She appears well-developed and well-nourished. No distress.  HENT:  Head: Normocephalic and atraumatic.  Right Ear: Hearing, tympanic membrane, external ear and ear canal normal.  Left Ear: Hearing, tympanic membrane, external ear and ear canal normal.  Nose: Nose normal.  Mouth/Throat: Uvula is midline, oropharynx is clear and moist and mucous membranes are normal. No oropharyngeal exudate.  Eyes: Conjunctivae and EOM are normal. Pupils are equal, round, and reactive to light. Right eye exhibits no discharge. Left eye exhibits no discharge. No scleral icterus.  Neck: Normal range of motion. Neck supple. No JVD present. Carotid bruit is not present. No  tracheal deviation present. No thyromegaly present.  Cardiovascular: Normal rate, regular rhythm, normal heart sounds and intact distal pulses.  Exam reveals no gallop and no friction rub.   No murmur heard. Pulmonary/Chest: Effort normal and breath sounds normal. No respiratory distress. She has no wheezes. She has no rales. She exhibits no tenderness. Right breast exhibits no inverted nipple, no mass, no nipple discharge, no skin change and no tenderness. Left breast exhibits no inverted nipple, no mass, no nipple discharge, no skin change and no tenderness. Breasts are symmetrical.  Abdominal: Soft. Bowel sounds are normal. She exhibits no distension and no mass. There is no tenderness. There is no rebound and no guarding. Hernia confirmed negative in the right inguinal area and confirmed negative in the left inguinal area.  Genitourinary: Rectum normal, vagina normal and uterus normal. No breast swelling, tenderness, discharge or bleeding. Pelvic exam was performed with patient supine. There is no rash, tenderness, lesion or injury on the right labia. There is no rash, tenderness, lesion or injury on the left labia. Cervix exhibits no motion tenderness, no discharge and no friability. Right adnexum displays no mass, no tenderness and no fullness. Left adnexum displays no mass, no tenderness and no fullness. No erythema, tenderness or bleeding in the vagina. No signs of injury around the vagina. No vaginal discharge found.  Musculoskeletal: Normal range of motion. She exhibits no edema or tenderness.  Lymphadenopathy:    She has no cervical adenopathy.       Right: No inguinal adenopathy present.       Left: No inguinal adenopathy present.  Neurological: She is alert and oriented to person, place, and time. She has normal reflexes. No cranial nerve deficit. Coordination normal.  Skin: Skin is warm and dry. No rash noted. She is not diaphoretic.  Psychiatric: She has a normal mood and affect. Her  behavior is normal. Judgment and thought content normal. Her speech is rapid and/or pressured. Cognition and memory are normal.  Vitals reviewed.    Depression Screen PHQ 2/9 Scores 10/16/2015  PHQ - 2 Score 0      Assessment & Plan:     Routine Health Maintenance and Physical Exam  Exercise Activities and Dietary recommendations Goals    None      Immunization History  Administered Date(s) Administered  . Td 11/23/2000  . Tdap 10/05/2014    Health Maintenance  Topic Date Due  . HIV Screening  08/19/1978  . PAP SMEAR  03/24/2015  . INFLUENZA VACCINE  06/23/2016  . MAMMOGRAM  09/19/2016  . TETANUS/TDAP  10/05/2024  . COLONOSCOPY  02/04/2025  . Hepatitis C Screening  Completed     Discussed health benefits of physical activity, and encouraged her to engage in regular exercise appropriate for her age and condition.  1. Annual physical exam Normal physical exam today. Will check labs as below and f/u pending lab results. If labs are stable and WNL she will not need to have these rechecked for one year at her next annual physical exam. She is to call the office in the meantime if she has any acute issue, questions or concerns. - CBC with Differential - Comprehensive metabolic panel - TSH  2. Breast cancer screening Breast exam today was normal. There is family history of breast cancer in her mother, onset age 53. She does perform regular self breast exams. Mammogram was ordered as below. [facility] given [facility] patient so she may schedule her mammogram at her convenience. - Mammogram Digital Screening; Future  3. Family history of heart disease Will check labs as below and f/u pending results. - Lipid panel  4. Encounter for lipid screening for cardiovascular disease Will check labs as below and f/u pending results. - Lipid panel  5. Cervical cancer screening Pap collected today. Will send as below and f/u pending results. - Pap  IG and HPV (high risk) DNA detection ([facility])  6. Need for hepatitis C screening test - Hepatitis C antibody screen  7. Screening for HIV without presence of risk factors - HIV antibody  8. Acute anxiety Worsening anxiety. Will add wellbutrin xl 150mg  to take every morning. Will not take ritalin morning dose, but will continue afternoon dose. She is to call if symptoms improve or fail to improve with addition of medication. - buPROPion (WELLBUTRIN XL) 150 MG 24 hr tablet; Take 1 tablet (150 mg total) by mouth daily.  Dispense: 30 tablet; Refill: 0  The entirety of the information documented in the History of Present Illness, Review of Systems and Physical Exam were personally obtained by me. Portions of this information were initially documented by Anson Oregonachelle Presley, CMA and reviewed by me for thoroughness and accuracy.   Margaretann LovelessJennifer M Burnette, PA-C  [facility]

## 2016-10-22 NOTE — Patient Instructions (Signed)

## 2016-10-26 ENCOUNTER — Telehealth: Payer: Self-pay

## 2016-10-26 LAB — PAP IG AND HPV HIGH-RISK
HPV, high-risk: NEGATIVE
PAP Smear Comment: 0

## 2016-10-26 NOTE — Telephone Encounter (Signed)
LMTCB  Thanks,  -Joseline 

## 2016-10-26 NOTE — Telephone Encounter (Signed)
-----   Message from Margaretann LovelessJennifer M Burnette, PA-C sent at 10/26/2016  8:28 AM EST ----- Pap is normal, HPV negative.  Will repeat in 3-5 years.

## 2016-10-27 NOTE — Telephone Encounter (Signed)
Patient advised as directed below.  Thanks,  -Jahne Krukowski 

## 2016-11-09 ENCOUNTER — Telehealth: Payer: Self-pay | Admitting: Physician Assistant

## 2016-11-09 DIAGNOSIS — G2581 Restless legs syndrome: Secondary | ICD-10-CM

## 2016-11-09 MED ORDER — ROPINIROLE HCL 0.5 MG PO TABS: ORAL_TABLET | ORAL | 0 refills | Status: DC

## 2016-11-09 NOTE — Telephone Encounter (Signed)
Pt called saying she forgot to tell Boneta LucksJenny on her last visit that she has restless leg syndrome.  She wants to know if Boneta LucksJenny will prescribe her gabapentin.  [facility]  Her call back is [phone]  [facility]

## 2016-11-09 NOTE — Telephone Encounter (Signed)
Patient advised as below. Patient verbalizes understanding and is in agreement with treatment plan.  

## 2016-11-09 NOTE — Telephone Encounter (Signed)
Has she ever used this medication before?

## 2016-11-09 NOTE — Telephone Encounter (Signed)
Patient reports that she has not tried medication before. Patient reports that her brother takes it and does pretty well on it. Patient not sure if is the right medication for her she is open to suggestions or willing to come in if needs to.

## 2016-11-09 NOTE — Telephone Encounter (Signed)
I normally start with requip instead. Gabapentin is normally used if you have failed other treatments. I will send requip and she can follow up in a month or so to see how it is working and make sure she tolerates. [facility] [address] [facility] to make sure she tolerates and that we dont need to keep increasing dose before I send to her mail in pharmacy

## 2016-11-18 ENCOUNTER — Other Ambulatory Visit: Payer: Self-pay | Admitting: Physician Assistant

## 2016-11-18 DIAGNOSIS — F419 Anxiety disorder, unspecified: Secondary | ICD-10-CM

## 2016-11-27 ENCOUNTER — Other Ambulatory Visit: Payer: Self-pay | Admitting: Physician Assistant

## 2016-11-27 DIAGNOSIS — F419 Anxiety disorder, unspecified: Secondary | ICD-10-CM

## 2016-11-27 MED ORDER — BUPROPION HCL ER (XL) 150 MG PO TB24: 150.0000 mg | ORAL_TABLET | Freq: Every day | ORAL | 1 refills | Status: DC

## 2016-11-27 NOTE — Telephone Encounter (Signed)
Pt contacted office for refill request on the following medications: buPROPion (WELLBUTRIN XL) 150 MG 24 hr tablet  Pt stated that her insurance changed and she would like an Rx for [facility] day supply [facility]. Please advise. Thanks TNP

## 2016-11-27 NOTE — Telephone Encounter (Signed)
Last written was 11/18/16 for Quantity of 30.   Because of her insurance she is requesting a 90 day supply.  Thanks,  -Cicily Bonano

## 2016-11-30 ENCOUNTER — Other Ambulatory Visit: Payer: Self-pay | Admitting: Physician Assistant

## 2016-11-30 DIAGNOSIS — F419 Anxiety disorder, unspecified: Secondary | ICD-10-CM

## 2016-11-30 MED ORDER — BUPROPION HCL ER (XL) 150 MG PO TB24: 150.0000 mg | ORAL_TABLET | Freq: Every day | ORAL | 1 refills | Status: DC

## 2016-11-30 NOTE — Telephone Encounter (Signed)
Pt is requesting to Rx for buPROPion (WELLBUTRIN XL) 150 MG 24 hr tablet resent to [facility]

## 2016-11-30 NOTE — Telephone Encounter (Signed)
Resent to [facility]. Allene DillonEmily Drozdowski, CMA

## 2016-12-06 ENCOUNTER — Other Ambulatory Visit: Payer: Self-pay | Admitting: Physician Assistant

## 2016-12-06 DIAGNOSIS — G2581 Restless legs syndrome: Secondary | ICD-10-CM

## 2016-12-07 NOTE — Telephone Encounter (Signed)
Patient states that she has not been consistently taking this medication. She took it for 1 week and did not find that it was helping much even with 2 tablets. She still has a bottle and will see how she does without medication and if she wants a refill she will call us back. Do not fill this medication at this time per patient. Thank you-aa

## 2016-12-07 NOTE — Telephone Encounter (Signed)
Can we see if she is taking 2 tabs at bedtime and if that is working?

## 2016-12-29 ENCOUNTER — Other Ambulatory Visit: Payer: Self-pay | Admitting: Physician Assistant

## 2016-12-29 MED ORDER — LEVOTHYROXINE SODIUM 88 MCG PO TABS: 88.0000 ug | ORAL_TABLET | Freq: Every day | ORAL | 3 refills | Status: DC

## 2016-12-29 NOTE — Telephone Encounter (Signed)
Pt contacted office for refill request on the following medications:  levothyroxine (SYNTHROID) 88 MCG tablet.  PT IS REQUESTING A 90 DAY SUPPLY.  [facility].  CB#[phone]/MW

## 2016-12-29 NOTE — Telephone Encounter (Signed)
Last ov 10/22/16 Last filled 03/31/16 Please review. Thank you. sd

## 2017-04-29 DIAGNOSIS — H93299 Other abnormal auditory perceptions, unspecified ear: Secondary | ICD-10-CM | POA: Diagnosis not present

## 2017-04-29 DIAGNOSIS — H9319 Tinnitus, unspecified ear: Secondary | ICD-10-CM | POA: Diagnosis not present

## 2017-05-07 DIAGNOSIS — F411 Generalized anxiety disorder: Secondary | ICD-10-CM | POA: Diagnosis not present

## 2017-08-02 ENCOUNTER — Telehealth: Payer: Self-pay | Admitting: Physician Assistant

## 2017-08-02 DIAGNOSIS — F900 Attention-deficit hyperactivity disorder, predominantly inattentive type: Secondary | ICD-10-CM

## 2017-08-02 MED ORDER — LISDEXAMFETAMINE DIMESYLATE 20 MG PO CAPS: 20.0000 mg | ORAL_CAPSULE | Freq: Every day | ORAL | 0 refills | Status: DC

## 2017-08-02 NOTE — Telephone Encounter (Signed)
Pt states in the past she has taken methylphenidate (RITALIN) 20 MG tablet  but would like to change to Vyvanse 20MG .  Pt is also asking id we would have a Rx coupon card? [facility] [address]  [phone]#[id number]/MW

## 2017-08-02 NOTE — Telephone Encounter (Signed)
Will print one month and she will need to schedule appt by the time for refill to make sure dose does not need to be adjusted. Ok to give payment card as well. If we do not have one she can also print from the Vyvanse website as well.

## 2017-08-02 NOTE — Telephone Encounter (Signed)
LMTCB  Thanks,  -Makensie Mulhall 

## 2017-08-02 NOTE — Telephone Encounter (Signed)
Please Review.  Thanks,  -Joseline 

## 2017-08-03 NOTE — Telephone Encounter (Signed)
LMTCB

## 2017-08-10 NOTE — Telephone Encounter (Signed)
[facility]se was rejected. Will start PA today.  Thanks,  -Calle Schader

## 2017-08-10 NOTE — Telephone Encounter (Signed)
Follow up appointment made and she already picked up Darl Pikes, RMA

## 2017-08-31 ENCOUNTER — Ambulatory Visit (INDEPENDENT_AMBULATORY_CARE_PROVIDER_SITE_OTHER): Payer: BLUE CROSS/BLUE SHIELD | Admitting: Physician Assistant

## 2017-08-31 ENCOUNTER — Encounter: Payer: Self-pay | Admitting: Physician Assistant

## 2017-08-31 VITALS — BP 110/78 | HR 84 | Temp 98.1°F | Resp 16 | Ht 67.0 in | Wt 162.0 lb

## 2017-08-31 DIAGNOSIS — F901 Attention-deficit hyperactivity disorder, predominantly hyperactive type: Secondary | ICD-10-CM | POA: Diagnosis not present

## 2017-08-31 MED ORDER — METHYLPHENIDATE HCL ER (OSM) 36 MG PO TBCR: 36.0000 mg | EXTENDED_RELEASE_TABLET | Freq: Every day | ORAL | 0 refills | Status: DC

## 2017-08-31 NOTE — Patient Instructions (Signed)
Methylphenidate extended-release tablets What is this medicine? METHYLPHENIDATE (meth il FEN i date) is used to treat attention-deficit hyperactivity disorder (ADHD). It is also used to treat narcolepsy. This medicine may be used for other purposes; ask your health care provider or pharmacist if you have questions. COMMON BRAND NAME(S): Concerta, Metadate ER, Methylin, Ritalin SR What should I tell my health care provider before I take this medicine? They need to know if you have any of these conditions: -anxiety or panic attacks -circulation problems in fingers and toes -difficulty swallowing, problems with the esophagus, or a history of blockage of the stomach or intestines -glaucoma -hardening or blockages of the arteries or heart blood vessels -heart disease or a heart defect -high blood pressure -history of a drug or alcohol abuse problem -history of stroke -liver disease -mental illness -motor tics, family history or diagnosis of Tourette's syndrome -seizures -suicidal thoughts, plans, or attempt; a previous suicide attempt by you or a family member -thyroid disease -an unusual or allergic reaction to methylphenidate, other medicines, foods, dyes, or preservatives -pregnant or trying to get pregnant -breast-feeding How should I use this medicine? Take this medicine by mouth with a glass of water. Follow the directions on the prescription label. Do not crush, cut, or chew the tablet. You may take this medicine with food. Take your medicine at regular intervals. Do not take it more often than directed. If you take your medicine more than once a day, try to take your last dose at least 8 hours before bedtime. This well help prevent the medicine from interfering with your sleep. A special MedGuide will be given to you by the pharmacist with each prescription and refill. Be sure to read this information carefully each time. Talk to your pediatrician regarding the use of this medicine in  children. While this drug may be prescribed for children as young as 6 years for selected conditions, precautions do apply. Overdosage: If you think you have taken too much of this medicine contact a poison control center or emergency room at once. NOTE: This medicine is only for you. Do not share this medicine with others. What if I miss a dose? If you miss a dose, take it as soon as you can. If it is almost time for your next dose, take only that dose. Do not take double or extra doses. What may interact with this medicine? Do not take this medicine with any of the following medications: -lithium -MAOIs like Carbex, Eldepryl, Marplan, Nardil, and Parnate -other stimulant medicines for attention disorders, weight loss, or to stay awake -procarbazine This medicine may also interact with the following medications: -atomoxetine -caffeine -certain medicines for blood pressure, heart disease, irregular heart beat -certain medicines for depression, anxiety, or psychotic disturbances -certain medicines for seizures like carbamazepine, phenobarbital, phenytoin -cold or allergy medicines -warfarin This list may not describe all possible interactions. Give your health care provider a list of all the medicines, herbs, non-prescription drugs, or dietary supplements you use. Also tell them if you smoke, drink alcohol, or use illegal drugs. Some items may interact with your medicine. What should I watch for while using this medicine? Visit your doctor or health care professional for regular checks on your progress. This prescription requires that you follow special procedures with your doctor and pharmacy. You will need to have a new written prescription from your doctor or health care professional every time you need a refill. This medicine may affect your concentration, or hide signs of tiredness. Until   you know how this drug affects you, do not drive, ride a bicycle, use machinery, or do anything that  needs mental alertness. Tell your doctor or health care professional if this medicine loses its effects, or if you feel you need to take more than the prescribed amount. Do not change the dosage without talking to your doctor or health care professional. For males, contact your doctor or health care professional right away if you have an erection that lasts longer than 4 hours or if it becomes painful. This may be a sign of a serious problem and must be treated right away to prevent permanent damage. Decreased appetite is a common side effect when starting this medicine. Eating small, frequent meals or snacks can help. Talk to your doctor if you continue to have poor eating habits. Height and weight growth of a child taking this medicine will be monitored closely. Do not take this medicine close to bedtime. It may prevent you from sleeping. The tablet shell for some brands of this medicine does not dissolve. This is normal. The tablet shell may appear whole in the stool. This is not a cause for concern. If you are going to need surgery, a MRI, CT scan, or other procedure, tell your doctor that you are taking this medicine. You may need to stop taking this medicine before the procedure. Tell your doctor or healthcare professional right away if you notice unexplained wounds on your fingers and toes while taking this medicine. You should also tell your healthcare provider if you experience numbness or pain, changes in the skin color, or sensitivity to temperature in your fingers or toes. What side effects may I notice from receiving this medicine? Side effects that you should report to your doctor or health care professional as soon as possible: -allergic reactions like skin rash, itching or hives, swelling of the face, lips, or tongue -changes in vision -chest pain or chest tightness -fast, irregular heartbeat -fingers or toes feel numb, cool, painful -hallucination, loss of contact with reality -high  blood pressure -males: prolonged or painful erection -seizures -severe headaches -severe stomach pain, vomiting -shortness of breath -suicidal thoughts or other mood changes -trouble swallowing -trouble walking, dizziness, loss of balance or coordination -uncontrollable head, mouth, neck, arm, or leg movements -unusual bleeding or bruising Side effects that usually do not require medical attention (report to your doctor or health care professional if they continue or are bothersome): -anxious -headache -loss of appetite -nausea -trouble sleeping -weight loss This list may not describe all possible side effects. Call your doctor for medical advice about side effects. You may report side effects to FDA at 1-800-FDA-1088. Where should I keep my medicine? Keep out of the reach of children. This medicine can be abused. Keep your medicine in a safe place to protect it from theft. Do not share this medicine with anyone. Selling or giving away this medicine is dangerous and against the law. This medicine may cause accidental overdose and death if taken by other adults, children, or pets. Mix any unused medicine with a substance like cat litter or coffee grounds. Then throw the medicine away in a sealed container like a sealed bag or a coffee can with a lid. Do not use the medicine after the expiration date. Store at room temperature between 15 and 30 degrees C (59 and 86 degrees F). Protect from light and moisture. Keep container tightly closed. NOTE: This sheet is a summary. It may not cover all possible information. If   you have questions about this medicine, talk to your doctor, pharmacist, or health care provider.  2018 Elsevier/Gold Standard (2014-07-31 15:32:32)  

## 2017-08-31 NOTE — Progress Notes (Signed)
Patient: Sherry Hammond Female    DOB: 12-29-1960   56 y.o.   MRN: [id number] Visit Date: 08/31/2017  Today's Provider: Margaretann Loveless, PA-C   Chief Complaint  Patient presents with  . Anxiety   Subjective:    HPI  Anxiety, Follow-up  She  was last seen for this 1 years ago. Changes made at last visit include added wellbutrin 150 mg QD.   She reports poor compliance with treatment. Patient reports that she stopped Wellbutrin for several months and restarted just this morning. She is having side effects. Headaches and very moody.   She reports not taking her medications daily. Current symptoms include: difficulty concentrating and anxiety She feels she is Unchanged since last visit.  ------------------------------------------------------------------------   Follow up for ADHD  The patient was last seen for this 1 years ago. Changes made at last visit include continue Ritalin once a day at afternoon.  She reports poor compliance with treatment. Patient has D/C Ritalin. Patient reports starting Vyvanse 20 mg a few weeks ago. She feels that condition is Unchanged. She is having side effects. Increased appetite and just not feeling well. ------------------------------------------------------------------------------------   Follow up for Restless leg syndrome  The patient was last seen for this 1 years ago. Changes made at last visit include start requip.  She reports poor compliance with treatment. Patient reports she does not take often. She feels that condition is Unchanged. She is not having side effects.  ------------------------------------------------------------------------------------    No Known Allergies   Current Outpatient Prescriptions:  .  buPROPion (WELLBUTRIN XL) 150 MG 24 hr tablet, Take 1 tablet (150 mg total) by mouth daily., Disp: 90 tablet, Rfl: 1 .  Cholecalciferol 1000 UNITS capsule, Take 2 capsules by mouth daily., Disp: ,  Rfl:  .  levothyroxine (SYNTHROID) 88 MCG tablet, Take 1 tablet (88 mcg total) by mouth daily., Disp: 90 tablet, Rfl: 3 .  lisdexamfetamine (VYVANSE) 20 MG capsule, Take 1 capsule (20 mg total) by mouth daily., Disp: 30 capsule, Rfl: 0 .  rOPINIRole (REQUIP) 0.5 MG tablet, Take 1/2 tab PO 1-3 hours before bedtime x 3 days, then increase to 1 tab PO q h.s. X 3 days then may increase to 2 tabs PO q h.s. (Patient not taking: Reported on 08/31/2017), Disp: 60 tablet, Rfl: 0  Review of Systems  Constitutional: Positive for activity change and appetite change.  HENT: Negative.   Respiratory: Negative.   Cardiovascular: Negative.   Psychiatric/Behavioral: Positive for agitation, confusion, decreased concentration and sleep disturbance. The patient is nervous/anxious.     Social History  Substance Use Topics  . Smoking status: Never Smoker  . Smokeless tobacco: Never Used  . Alcohol use Yes     Comment: occasional   Objective:   BP 110/78 (BP Location: Left Arm, Patient Position: Sitting, Cuff Size: Normal)   Pulse 84   Temp 98.1 F (36.7 C) (Oral)   Resp 16   Ht  (1.702 m)   Wt 162 lb (73.5 kg)   SpO2 98%   BMI 25.37 kg/m  Vitals:   08/31/17 1119  BP: 110/78  Pulse: 84  Resp: 16  Temp: 98.1 F (36.7 C)  TempSrc: Oral  SpO2: 98%  Weight: 162 lb (73.5 kg)  Height:  (1.702 m)     Physical Exam  Constitutional: She appears well-developed and well-nourished. No distress.  Neck: Normal range of motion. Neck supple.  Cardiovascular: Normal rate, regular rhythm and  normal heart sounds.  Exam reveals no gallop and no friction rub.   No murmur heard. Pulmonary/Chest: Effort normal and breath sounds normal. No respiratory distress. She has no wheezes. She has no rales.  Skin: She is not diaphoretic.  Psychiatric: Judgment and thought content normal. Her mood appears anxious. Her speech is rapid and/or pressured. She is hyperactive. Cognition and memory are normal.  Vitals  reviewed.  Depression screen PHQ 2/9 08/31/2017  Decreased Interest 2  Down, Depressed, Hopeless 1  PHQ - 2 Score 3  Altered sleeping 1  Tired, decreased energy 0  Change in appetite 1  Feeling bad or failure about yourself  1  Trouble concentrating 1  Moving slowly or fidgety/restless 0  Suicidal thoughts 0  PHQ-9 Score 7  Difficult doing work/chores Somewhat difficult   GAD 7 : Generalized Anxiety Score 08/31/2017  Nervous, Anxious, on Edge 2  Control/stop worrying 0  Worry too much - different things 1  Trouble relaxing 2  Restless 2  Easily annoyed or irritable 2  Afraid - awful might happen 0  Total GAD 7 Score 9  Anxiety Difficulty Somewhat difficult      Assessment & Plan:     1. Attention deficit hyperactivity disorder (ADHD), predominantly hyperactive type Worsening symptoms. Patient has tried Adderall XR (ineffective), Adderall IR (ineffective), Ritalin (ineffective), and Vyvanse (headaches). All have been either ineffective or have caused side effects. She has used Concerta in the past and was successful but had to change due to insurance not covering. Since she has tried other generics without them being as effective. Will restart Concerta as below. I will see her back in 4-6 weeks to see how she is doing with the medication.  - methylphenidate 36 MG PO CR tablet; Take 1 tablet (36 mg total) by mouth daily.  Dispense: 30 tablet; Refill: 0       Margaretann Loveless, PA-C  [facility]

## 2017-09-20 ENCOUNTER — Encounter: Payer: Self-pay | Admitting: Physician Assistant

## 2017-09-29 ENCOUNTER — Telehealth: Payer: Self-pay | Admitting: Physician Assistant

## 2017-09-29 DIAGNOSIS — F901 Attention-deficit hyperactivity disorder, predominantly hyperactive type: Secondary | ICD-10-CM

## 2017-09-29 NOTE — Telephone Encounter (Signed)
Pt contacted office for refill request on the following medications:  methylphenidate 36 MG PO CR tablet   [facility] [address]  [phone]#[phone]/MW

## 2017-09-30 MED ORDER — METHYLPHENIDATE HCL ER (OSM) 36 MG PO TBCR: 36.0000 mg | EXTENDED_RELEASE_TABLET | Freq: Every day | ORAL | 0 refills | Status: DC

## 2017-09-30 NOTE — Telephone Encounter (Signed)
NCCSR reviewed and Rx printed x 3. Should not need refills until 12/2017

## 2017-09-30 NOTE — Telephone Encounter (Signed)
LM that prescriptions placed up front ready for pick up.  Thanks,  -Joseline

## 2017-10-26 ENCOUNTER — Other Ambulatory Visit: Payer: Self-pay

## 2017-10-26 ENCOUNTER — Ambulatory Visit (INDEPENDENT_AMBULATORY_CARE_PROVIDER_SITE_OTHER): Payer: BLUE CROSS/BLUE SHIELD | Admitting: Physician Assistant

## 2017-10-26 ENCOUNTER — Encounter: Payer: Self-pay | Admitting: Physician Assistant

## 2017-10-26 VITALS — BP 120/80 | HR 68 | Temp 97.8°F | Resp 16 | Ht 67.0 in | Wt 157.8 lb

## 2017-10-26 DIAGNOSIS — Z1231 Encounter for screening mammogram for malignant neoplasm of breast: Secondary | ICD-10-CM | POA: Diagnosis not present

## 2017-10-26 DIAGNOSIS — E039 Hypothyroidism, unspecified: Secondary | ICD-10-CM

## 2017-10-26 DIAGNOSIS — Z0000 Encounter for general adult medical examination without abnormal findings: Secondary | ICD-10-CM

## 2017-10-26 DIAGNOSIS — J309 Allergic rhinitis, unspecified: Secondary | ICD-10-CM | POA: Diagnosis not present

## 2017-10-26 DIAGNOSIS — Z114 Encounter for screening for human immunodeficiency virus [HIV]: Secondary | ICD-10-CM

## 2017-10-26 DIAGNOSIS — Z1239 Encounter for other screening for malignant neoplasm of breast: Secondary | ICD-10-CM

## 2017-10-26 DIAGNOSIS — E559 Vitamin D deficiency, unspecified: Secondary | ICD-10-CM | POA: Diagnosis not present

## 2017-10-26 DIAGNOSIS — Z2821 Immunization not carried out because of patient refusal: Secondary | ICD-10-CM

## 2017-10-26 DIAGNOSIS — E785 Hyperlipidemia, unspecified: Secondary | ICD-10-CM | POA: Diagnosis not present

## 2017-10-26 NOTE — Progress Notes (Signed)
Patient: Sherry Hammond, Female    DOB: [birth year], 56 y.o.   MRN: [id number] Visit Date: 10/26/2017  Today's Provider: Margaretann LovelessJennifer M Barnard Sharps, PA-C   Chief Complaint  Patient presents with  . Annual Exam   Subjective:    Annual physical exam Sherry DownerJennifer M Hammond is a 56 y.o. female who presents today for health maintenance and complete physical. She feels well. She reports exercising none. She reports she is sleeping well.  10/22/16 CPE 10/22/16 Pap-neg; HPV-neg 02/05/15 Colonoscopy-polyps 3/15/2-16 Mammogram-BiRads:1, [place] [facility]; patient prefers to switch back to [place] this year -----------------------------------------------------------------   Review of Systems  Constitutional: Negative.   HENT: Negative.   Eyes: Negative.   Respiratory: Negative.   Cardiovascular: Negative.   Gastrointestinal: Negative.   Endocrine: Negative.   Genitourinary: Negative.   Musculoskeletal: Positive for arthralgias and neck pain.  Skin: Negative.   Allergic/Immunologic: Negative.   Neurological: Negative.   Hematological: Negative.   Psychiatric/Behavioral: The patient is nervous/anxious and is hyperactive.     Social History      She  reports that  has never smoked. she has never used smokeless tobacco. She reports that she drinks alcohol. She reports that she does not use drugs.       Social History   Socioeconomic History  . Marital status: Married    Spouse name: Loistine Chancehilip  . Number of children: 4  . Years of education: Bachelor's  . Highest education level: None  Social Needs  . Financial resource strain: None  . Food insecurity - worry: None  . Food insecurity - inability: None  . Transportation needs - medical: None  . Transportation needs - non-medical: None  Occupational History  . Occupation: Unemployed  Tobacco Use  . Smoking status: Never Smoker  . Smokeless tobacco: Never Used  Substance and Sexual Activity  . Alcohol use: Yes    Comment:  occasional  . Drug use: No  . Sexual activity: Yes  Other Topics Concern  . None  Social History Narrative  . None    Past Medical History:  Diagnosis Date  . Allergy   . Depression   . Hyperlipidemia   . Thyroid disease      Patient Active Problem List   Diagnosis Date Noted  . Paresthesia 07/22/2015  . Allergic rhinitis 07/12/2015  . Ache in joint 07/12/2015  . Cerumen impaction 07/12/2015  . Hyperlipidemia 07/12/2015  . Hypo-osmolality and hyponatremia 07/12/2015  . Fungal infection of nail 07/12/2015  . Headache, tension-type 07/12/2015  . Adult hypothyroidism 01/30/2009  . Avitaminosis D 01/24/2009  . 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 CESAREAN SECTION      Family History        Family Status  Relation Name Status  . Mother  Alive  . Father  Alive  . Sister  Alive  . Brother  Alive  . Sister  Alive        Her family history includes Cancer in her mother; Healthy in her brother, sister, and sister; Hyperlipidemia in her father and mother; Hypertension in her father and mother; Kidney disease in her father.     No Known Allergies   Current Outpatient Medications:  .  Cholecalciferol 1000 UNITS capsule, Take 2 capsules by mouth daily., Disp: , Rfl:  .  levothyroxine (SYNTHROID) 88 MCG tablet, Take 1 tablet (88 mcg total) by mouth daily., Disp: 90 tablet, Rfl: 3 .  methylphenidate 36 MG PO CR tablet, Take 1 tablet (36 mg total) daily by mouth. Please do NOT fill until 12/01/17, Disp: 30 tablet, Rfl: 0   Patient Care Team: Margaretann Loveless, PA-C as PCP - General (Family Medicine)      Objective:   Vitals: BP 120/80 (BP Location: Left Arm, Patient Position: Sitting, Cuff Size: Normal)   Pulse 68   Temp 97.8 F (36.6 C) (Oral)   Resp 16   Ht 5\' 7"  (1.702 m)   Wt 157 lb 12.8 oz (71.6 kg)   SpO2 98%   BMI 24.71 kg/m    Vitals:   10/26/17 1015  BP: 120/80  Pulse: 68  Resp: 16    Temp: 97.8 F (36.6 C)  TempSrc: Oral  SpO2: 98%  Weight: 157 lb 12.8 oz (71.6 kg)  Height: 5\' 7"  (1.702 m)     Physical Exam  Constitutional: She is oriented to person, place, and time. She appears well-developed and well-nourished. No distress.  HENT:  Head: Normocephalic and atraumatic.  Right Ear: Hearing, tympanic membrane, external ear and ear canal normal.  Left Ear: Hearing, tympanic membrane, external ear and ear canal normal.  Nose: Nose normal.  Mouth/Throat: Uvula is midline, oropharynx is clear and moist and mucous membranes are normal. No oropharyngeal exudate.  Eyes: Conjunctivae and EOM are normal. Pupils are equal, round, and reactive to light. Right eye exhibits no discharge. Left eye exhibits no discharge. No scleral icterus.  Neck: Normal range of motion. Neck supple. No JVD present. Carotid bruit is not present. No tracheal deviation present. No thyromegaly present.  Cardiovascular: Normal rate, regular rhythm, normal heart sounds and intact distal pulses. Exam reveals no gallop and no friction rub.  No murmur heard. Pulmonary/Chest: Effort normal and breath sounds normal. No respiratory distress. She has no wheezes. She has no rales. She exhibits no tenderness. Right breast exhibits no inverted nipple, no mass, no nipple discharge, no skin change and no tenderness. Left breast exhibits no inverted nipple, no mass, no nipple discharge, no skin change and no tenderness. Breasts are symmetrical.  Abdominal: Soft. Bowel sounds are normal. She exhibits no distension and no mass. There is no tenderness. There is no rebound and no guarding.  Musculoskeletal: Normal range of motion. She exhibits no edema or tenderness.  Lymphadenopathy:    She has no cervical adenopathy.  Neurological: She is alert and oriented to person, place, and time.  Skin: Skin is warm and dry. No rash noted. She is not diaphoretic.  Psychiatric: She has a normal mood and affect. Her behavior is  normal. Judgment and thought content normal.  Vitals reviewed.    Depression Screen PHQ 2/9 Scores 10/26/2017 08/31/2017 10/16/2015  PHQ - 2 Score 2 3 0  PHQ- 9 Score 2 7 -      Assessment & Plan:     Routine Health Maintenance and Physical Exam  Exercise Activities and Dietary recommendations Goals    None      Immunization History  Administered Date(s) Administered  . Influenza,inj,Quad PF,6+ Mos 10/05/2014  . Td 11/23/2000  . Tdap 10/05/2014    Health Maintenance  Topic Date Due  . HIV Screening  08/19/1978  . MAMMOGRAM  09/19/2016  . INFLUENZA VACCINE  02/20/2018 (Originally 06/23/2017)  . PAP SMEAR  10/23/2019  . TETANUS/TDAP  10/05/2024  . COLONOSCOPY  02/04/2025  . Hepatitis C Screening  Completed     Discussed health benefits of physical activity, and encouraged her to engage  in regular exercise appropriate for her age and condition.    1. Annual physical exam Normal physical exam today. Will check labs as below and f/u pending lab results. If labs are stable and WNL she will not need to have these rechecked for one year at her next annual physical exam. She is to call the office in the meantime if she has any acute issue, questions or concerns. - CBC with Differential/Platelet - Comprehensive metabolic panel  2. Breast cancer screening Breast exam today was normal. There is no family history of breast cancer. She does perform regular self breast exams. Mammogram was ordered as below. Information [facility] was given to patient so she may schedule her mammogram at her convenience. - MM DIGITAL SCREENING BILATERAL; Future  3. Allergic rhinitis, unspecified seasonality, unspecified trigger Stable at this time.  4. Adult hypothyroidism Stable on levothyroxine 88mcg. Will check labs as below and f/u pending results. - CBC with Differential/Platelet - Comprehensive metabolic panel - TSH  5. Hyperlipidemia, unspecified hyperlipidemia  type H/O this but diet controlled. Will check labs as below and f/u pending results. - CBC with Differential/Platelet - Comprehensive metabolic panel - Lipid panel  6. Avitaminosis D H/O this and not on supplementation. Will check labs as below and f/u pending results. - CBC with Differential/Platelet - Comprehensive metabolic panel - VITAMIN D 25 Hydroxy (Vit-D Deficiency, Fractures)  7. Screening for HIV without presence of risk factors - HIV antibody  8. Influenza vaccination declined --------------------------------------------------------------------    Margaretann LovelessJennifer M Shonika Kolasinski, PA-C  [facility]

## 2017-10-26 NOTE — Patient Instructions (Signed)
Health Maintenance for Postmenopausal Women Menopause is a normal process in which your reproductive ability comes to an end. This process happens gradually over a span of months to years, usually between the ages of 22 and 9. Menopause is complete when you have missed 12 consecutive menstrual periods. It is important to talk with your health care provider about some of the most common conditions that affect postmenopausal women, such as heart disease, cancer, and bone loss (osteoporosis). Adopting a healthy lifestyle and getting preventive care can help to promote your health and wellness. Those actions can also lower your chances of developing some of these common conditions. What should I know about menopause? During menopause, you may experience a number of symptoms, such as:  Moderate-to-severe hot flashes.  Night sweats.  Decrease in sex drive.  Mood swings.  Headaches.  Tiredness.  Irritability.  Memory problems.  Insomnia.  Choosing to treat or not to treat menopausal changes is an individual decision that you make with your health care provider. What should I know about hormone replacement therapy and supplements? Hormone therapy products are effective for treating symptoms that are associated with menopause, such as hot flashes and night sweats. Hormone replacement carries certain risks, especially as you become older. If you are thinking about using estrogen or estrogen with progestin treatments, discuss the benefits and risks with your health care provider. What should I know about heart disease and stroke? Heart disease, heart attack, and stroke become more likely as you age. This may be due, in part, to the hormonal changes that your body experiences during menopause. These can affect how your body processes dietary fats, triglycerides, and cholesterol. Heart attack and stroke are both medical emergencies. There are many things that you can do to help prevent heart disease  and stroke:  Have your blood pressure checked at least every 1-2 years. High blood pressure causes heart disease and increases the risk of stroke.  If you are 53-22 years old, ask your health care provider if you should take aspirin to prevent a heart attack or a stroke.  Do not use any tobacco products, including cigarettes, chewing tobacco, or electronic cigarettes. If you need help quitting, ask your health care provider.  It is important to eat a healthy diet and maintain a healthy weight. ? Be sure to include plenty of vegetables, fruits, low-fat dairy products, and lean protein. ? Avoid eating foods that are high in solid fats, added sugars, or salt (sodium).  Get regular exercise. This is one of the most important things that you can do for your health. ? Try to exercise for at least 150 minutes each week. The type of exercise that you do should increase your heart rate and make you sweat. This is known as moderate-intensity exercise. ? Try to do strengthening exercises at least twice each week. Do these in addition to the moderate-intensity exercise.  Know your numbers.Ask your health care provider to check your cholesterol and your blood glucose. Continue to have your blood tested as directed by your health care provider.  What should I know about cancer screening? There are several types of cancer. Take the following steps to reduce your risk and to catch any cancer development as early as possible. Breast Cancer  Practice breast self-awareness. ? This means understanding how your breasts normally appear and feel. ? It also means doing regular breast self-exams. Let your health care provider know about any changes, no matter how small.  If you are 40  or older, have a clinician do a breast exam (clinical breast exam or CBE) every year. Depending on your age, family history, and medical history, it may be recommended that you also have a yearly breast X-ray (mammogram).  If you  have a family history of breast cancer, talk with your health care provider about genetic screening.  If you are at high risk for breast cancer, talk with your health care provider about having an MRI and a mammogram every year.  Breast cancer (BRCA) gene test is recommended for women who have family members with BRCA-related cancers. Results of the assessment will determine the need for genetic counseling and BRCA1 and for BRCA2 testing. BRCA-related cancers include these types: ? Breast. This occurs in males or females. ? Ovarian. ? Tubal. This may also be called fallopian tube cancer. ? Cancer of the abdominal or pelvic lining (peritoneal cancer). ? Prostate. ? Pancreatic.  Cervical, Uterine, and Ovarian Cancer Your health care provider may recommend that you be screened regularly for cancer of the pelvic organs. These include your ovaries, uterus, and vagina. This screening involves a pelvic exam, which includes checking for microscopic changes to the surface of your cervix (Pap test).  For women ages 21-65, health care providers may recommend a pelvic exam and a Pap test every three years. For women ages 79-65, they may recommend the Pap test and pelvic exam, combined with testing for human papilloma virus (HPV), every five years. Some types of HPV increase your risk of cervical cancer. Testing for HPV may also be done on women of any age who have unclear Pap test results.  Other health care providers may not recommend any screening for nonpregnant women who are considered low risk for pelvic cancer and have no symptoms. Ask your health care provider if a screening pelvic exam is right for you.  If you have had past treatment for cervical cancer or a condition that could lead to cancer, you need Pap tests and screening for cancer for at least 20 years after your treatment. If Pap tests have been discontinued for you, your risk factors (such as having a new sexual partner) need to be  reassessed to determine if you should start having screenings again. Some women have medical problems that increase the chance of getting cervical cancer. In these cases, your health care provider may recommend that you have screening and Pap tests more often.  If you have a family history of uterine cancer or ovarian cancer, talk with your health care provider about genetic screening.  If you have vaginal bleeding after reaching menopause, tell your health care provider.  There are currently no reliable tests available to screen for ovarian cancer.  Lung Cancer Lung cancer screening is recommended for adults 69-62 years old who are at high risk for lung cancer because of a history of smoking. A yearly low-dose CT scan of the lungs is recommended if you:  Currently smoke.  Have a history of at least 30 pack-years of smoking and you currently smoke or have quit within the past 15 years. A pack-year is smoking an average of one pack of cigarettes per day for one year.  Yearly screening should:  Continue until it has been 15 years since you quit.  Stop if you develop a health problem that would prevent you from having lung cancer treatment.  Colorectal Cancer  This type of cancer can be detected and can often be prevented.  Routine colorectal cancer screening usually begins at  age 42 and continues through age 45.  If you have risk factors for colon cancer, your health care provider may recommend that you be screened at an earlier age.  If you have a family history of colorectal cancer, talk with your health care provider about genetic screening.  Your health care provider may also recommend using home test kits to check for hidden blood in your stool.  A small camera at the end of a tube can be used to examine your colon directly (sigmoidoscopy or colonoscopy). This is done to check for the earliest forms of colorectal cancer.  Direct examination of the colon should be repeated every  5-10 years until age 71. However, if early forms of precancerous polyps or small growths are found or if you have a family history or genetic risk for colorectal cancer, you may need to be screened more often.  Skin Cancer  Check your skin from head to toe regularly.  Monitor any moles. Be sure to tell your health care provider: ? About any new moles or changes in moles, especially if there is a change in a mole's shape or color. ? If you have a mole that is larger than the size of a pencil eraser.  If any of your family members has a history of skin cancer, especially at a young age, talk with your health care provider about genetic screening.  Always use sunscreen. Apply sunscreen liberally and repeatedly throughout the day.  Whenever you are outside, protect yourself by wearing long sleeves, pants, a wide-brimmed hat, and sunglasses.  What should I know about osteoporosis? Osteoporosis is a condition in which bone destruction happens more quickly than new bone creation. After menopause, you may be at an increased risk for osteoporosis. To help prevent osteoporosis or the bone fractures that can happen because of osteoporosis, the following is recommended:  If you are 46-71 years old, get at least 1,000 mg of calcium and at least 600 mg of vitamin D per day.  If you are older than age 55 but younger than age 65, get at least 1,200 mg of calcium and at least 600 mg of vitamin D per day.  If you are older than age 54, get at least 1,200 mg of calcium and at least 800 mg of vitamin D per day.  Smoking and excessive alcohol intake increase the risk of osteoporosis. Eat foods that are rich in calcium and vitamin D, and do weight-bearing exercises several times each week as directed by your health care provider. What should I know about how menopause affects my mental health? Depression may occur at any age, but it is more common as you become older. Common symptoms of depression  include:  Low or sad mood.  Changes in sleep patterns.  Changes in appetite or eating patterns.  Feeling an overall lack of motivation or enjoyment of activities that you previously enjoyed.  Frequent crying spells.  Talk with your health care provider if you think that you are experiencing depression. What should I know about immunizations? It is important that you get and maintain your immunizations. These include:  Tetanus, diphtheria, and pertussis (Tdap) booster vaccine.  Influenza every year before the flu season begins.  Pneumonia vaccine.  Shingles vaccine.  Your health care provider may also recommend other immunizations. This information is not intended to replace advice given to you by your health care provider. Make sure you discuss any questions you have with your health care provider. Document Released: 01/01/2006  Document Revised: 05/29/2016 Document Reviewed: 08/13/2015 Elsevier Interactive Patient Education  2018 [employer]vier Inc[employer]  

## 2017-10-27 LAB — COMPLETE METABOLIC PANEL WITH GFR
AG RATIO: 1.5 (calc) (ref 1.0–2.5)
ALBUMIN MSPROF: 4.4 g/dL (ref 3.6–5.1)
ALKALINE PHOSPHATASE (APISO): 60 U/L (ref 33–130)
ALT: 13 U/L (ref 6–29)
AST: 25 U/L (ref 10–35)
BUN: 16 mg/dL (ref 7–25)
CALCIUM: 9.8 mg/dL (ref 8.6–10.4)
CO2: 28 mmol/L (ref 20–32)
Chloride: 102 mmol/L (ref 98–110)
Creat: 1.05 mg/dL (ref 0.50–1.05)
GFR, EST NON AFRICAN AMERICAN: 60 mL/min/{1.73_m2} (ref >=60)
GFR, Est African American: 70 mL/min/{1.73_m2} (ref >=60)
GLOBULIN: 2.9 g/dL (ref 1.9–3.7)
Glucose, Bld: 87 mg/dL (ref 65–99)
POTASSIUM: 4 mmol/L (ref 3.5–5.3)
SODIUM: 139 mmol/L (ref 135–146)
Total Bilirubin: 0.9 mg/dL (ref 0.2–1.2)
Total Protein: 7.3 g/dL (ref 6.1–8.1)

## 2017-10-27 LAB — CBC WITH DIFFERENTIAL/PLATELET
Basophils Absolute: 62 cells/uL (ref 0–200)
Basophils Relative: 1.4 %
Eosinophils Absolute: 150 cells/uL (ref 15–500)
Eosinophils Relative: 3.4 %
HEMATOCRIT: 40.2 % (ref 35.0–45.0)
HEMOGLOBIN: 13.5 g/dL (ref 11.7–15.5)
LYMPHS ABS: 1698 {cells}/uL (ref 850–3900)
MCH: 29.9 pg (ref 27.0–33.0)
MCHC: 33.6 g/dL (ref 32.0–36.0)
MCV: 88.9 fL (ref 80.0–100.0)
MPV: 9.1 fL (ref 7.5–12.5)
Monocytes Relative: 7.4 %
NEUTROS ABS: 2165 {cells}/uL (ref 1500–7800)
Neutrophils Relative %: 49.2 %
Platelets: 252 10*3/uL (ref 140–400)
RBC: 4.52 10*6/uL (ref 3.80–5.10)
RDW: 12 % (ref 11.0–15.0)
Total Lymphocyte: 38.6 %
WBC mixed population: 326 cells/uL (ref 200–950)
WBC: 4.4 10*3/uL (ref 3.8–10.8)

## 2017-10-27 LAB — LIPID PANEL
CHOL/HDL RATIO: 3 (calc) (ref <5.0)
Cholesterol: 233 mg/dL — ABNORMAL HIGH (ref <200)
HDL: 78 mg/dL (ref >50)
LDL Cholesterol (Calc): 140 mg/dL (calc) — ABNORMAL HIGH
NON-HDL CHOLESTEROL (CALC): 155 mg/dL — AB (ref <130)
TRIGLYCERIDES: 56 mg/dL (ref <150)

## 2017-10-27 LAB — VITAMIN D 25 HYDROXY (VIT D DEFICIENCY, FRACTURES): VIT D 25 HYDROXY: 51 ng/mL (ref 30–100)

## 2017-10-27 LAB — TSH: TSH: 1.74 mIU/L

## 2017-10-27 LAB — HIV ANTIBODY (ROUTINE TESTING W REFLEX): HIV 1&2 Ab, 4th Generation: NONREACTIVE

## 2017-11-22 ENCOUNTER — Encounter: Payer: Self-pay | Admitting: Physician Assistant

## 2017-11-22 DIAGNOSIS — F902 Attention-deficit hyperactivity disorder, combined type: Secondary | ICD-10-CM

## 2017-11-24 MED ORDER — GUANFACINE HCL ER 1 MG PO TB24: 1.0000 mg | ORAL_TABLET | Freq: Every day | ORAL | 0 refills | Status: DC

## 2017-11-30 ENCOUNTER — Encounter: Payer: Self-pay | Admitting: Physician Assistant

## 2017-12-07 ENCOUNTER — Encounter: Payer: Self-pay | Admitting: Physician Assistant

## 2017-12-07 DIAGNOSIS — F902 Attention-deficit hyperactivity disorder, combined type: Secondary | ICD-10-CM

## 2017-12-07 MED ORDER — GUANFACINE HCL ER 3 MG PO TB24: 1.0000 | ORAL_TABLET | Freq: Every day | ORAL | 0 refills | Status: DC

## 2017-12-13 ENCOUNTER — Other Ambulatory Visit: Payer: Self-pay | Admitting: Physician Assistant

## 2017-12-13 DIAGNOSIS — E039 Hypothyroidism, unspecified: Secondary | ICD-10-CM

## 2017-12-13 DIAGNOSIS — F419 Anxiety disorder, unspecified: Secondary | ICD-10-CM

## 2017-12-16 ENCOUNTER — Other Ambulatory Visit: Payer: Self-pay | Admitting: Physician Assistant

## 2017-12-16 DIAGNOSIS — F902 Attention-deficit hyperactivity disorder, combined type: Secondary | ICD-10-CM

## 2017-12-16 MED ORDER — GUANFACINE HCL ER 3 MG PO TB24: 1.0000 | ORAL_TABLET | Freq: Every day | ORAL | 0 refills | Status: DC

## 2017-12-16 NOTE — Telephone Encounter (Signed)
[facility] pharmacy faxed a refill request for a 90-days supply for the following medication. Thanks CC   GuanFACINE HCl 3 MG TB24

## 2017-12-21 ENCOUNTER — Encounter: Payer: Self-pay | Admitting: Physician Assistant

## 2017-12-21 DIAGNOSIS — F902 Attention-deficit hyperactivity disorder, combined type: Secondary | ICD-10-CM

## 2017-12-21 DIAGNOSIS — E039 Hypothyroidism, unspecified: Secondary | ICD-10-CM

## 2017-12-22 MED ORDER — GUANFACINE HCL ER 1 MG PO TB24: 1.0000 mg | ORAL_TABLET | Freq: Every day | ORAL | 1 refills | Status: DC

## 2017-12-22 MED ORDER — LEVOTHYROXINE SODIUM 88 MCG PO TABS: 88.0000 ug | ORAL_TABLET | Freq: Every day | ORAL | 3 refills | Status: DC

## 2018-01-03 ENCOUNTER — Other Ambulatory Visit: Payer: Self-pay | Admitting: Physician Assistant

## 2018-01-03 DIAGNOSIS — F901 Attention-deficit hyperactivity disorder, predominantly hyperactive type: Secondary | ICD-10-CM

## 2018-01-03 MED ORDER — METHYLPHENIDATE HCL ER (OSM) 36 MG PO TBCR: 36.0000 mg | EXTENDED_RELEASE_TABLET | Freq: Every day | ORAL | 0 refills | Status: DC

## 2018-01-03 NOTE — Telephone Encounter (Signed)
Patient is requesting a refill for the following medication  methylphenidate 36 MG PO CR tablet   She states that she would like a 90 day supply [facility]

## 2018-01-04 ENCOUNTER — Encounter: Payer: Self-pay | Admitting: Physician Assistant

## 2018-01-04 DIAGNOSIS — F901 Attention-deficit hyperactivity disorder, predominantly hyperactive type: Secondary | ICD-10-CM

## 2018-01-05 MED ORDER — METHYLPHENIDATE HCL ER (OSM) 36 MG PO TBCR: 36.0000 mg | EXTENDED_RELEASE_TABLET | Freq: Every day | ORAL | 0 refills | Status: DC

## 2018-05-02 ENCOUNTER — Encounter: Payer: Self-pay | Admitting: Physician Assistant

## 2018-05-02 DIAGNOSIS — F901 Attention-deficit hyperactivity disorder, predominantly hyperactive type: Secondary | ICD-10-CM

## 2018-05-02 MED ORDER — METHYLPHENIDATE HCL ER (OSM) 36 MG PO TBCR: 36.0000 mg | EXTENDED_RELEASE_TABLET | Freq: Every day | ORAL | 0 refills | Status: DC

## 2018-05-02 NOTE — Telephone Encounter (Signed)
NCCSR reviewed. 

## 2018-08-09 ENCOUNTER — Encounter: Payer: Self-pay | Admitting: Physician Assistant

## 2018-08-22 ENCOUNTER — Ambulatory Visit
Admission: RE | Admit: 2018-08-22 | Discharge: 2018-08-22 | Disposition: A | Discharge status: Home / Self Care | Payer: BLUE CROSS/BLUE SHIELD | Source: Ambulatory Visit | Attending: Physician Assistant | Admitting: Physician Assistant

## 2018-08-22 ENCOUNTER — Other Ambulatory Visit: Payer: Self-pay | Admitting: Physician Assistant

## 2018-08-22 DIAGNOSIS — Z1239 Encounter for other screening for malignant neoplasm of breast: Secondary | ICD-10-CM

## 2018-08-22 DIAGNOSIS — N6310 Unspecified lump in the right breast, unspecified quadrant: Secondary | ICD-10-CM

## 2018-08-22 DIAGNOSIS — Z1231 Encounter for screening mammogram for malignant neoplasm of breast: Secondary | ICD-10-CM | POA: Diagnosis not present

## 2018-08-22 DIAGNOSIS — R928 Other abnormal and inconclusive findings on diagnostic imaging of breast: Secondary | ICD-10-CM

## 2018-08-30 ENCOUNTER — Telehealth: Payer: Self-pay | Admitting: Physician Assistant

## 2018-08-30 DIAGNOSIS — F901 Attention-deficit hyperactivity disorder, predominantly hyperactive type: Secondary | ICD-10-CM

## 2018-08-30 MED ORDER — METHYLPHENIDATE HCL ER (OSM) 36 MG PO TBCR: 36.0000 mg | EXTENDED_RELEASE_TABLET | Freq: Every day | ORAL | 0 refills | Status: DC

## 2018-08-30 NOTE — Telephone Encounter (Signed)
refilled 

## 2018-08-30 NOTE — Telephone Encounter (Signed)
Pt needs refill on her generic Concerta 36  90 days supply  [facility]  CB#  [phone]  Thanks Barth Kirks

## 2018-09-08 ENCOUNTER — Other Ambulatory Visit: Payer: BLUE CROSS/BLUE SHIELD

## 2018-09-08 ENCOUNTER — Ambulatory Visit: Payer: BLUE CROSS/BLUE SHIELD

## 2018-10-27 ENCOUNTER — Encounter: Payer: BLUE CROSS/BLUE SHIELD | Admitting: Physician Assistant

## 2018-10-31 ENCOUNTER — Other Ambulatory Visit: Payer: Self-pay

## 2018-10-31 ENCOUNTER — Ambulatory Visit (INDEPENDENT_AMBULATORY_CARE_PROVIDER_SITE_OTHER): Payer: BLUE CROSS/BLUE SHIELD | Admitting: Physician Assistant

## 2018-10-31 ENCOUNTER — Encounter: Payer: Self-pay | Admitting: Physician Assistant

## 2018-10-31 VITALS — BP 136/81 | HR 72 | Temp 97.8°F | Resp 16 | Ht 67.5 in | Wt 154.2 lb

## 2018-10-31 DIAGNOSIS — F3176 Bipolar disorder, in full remission, most recent episode depressed: Secondary | ICD-10-CM

## 2018-10-31 DIAGNOSIS — Z0000 Encounter for general adult medical examination without abnormal findings: Secondary | ICD-10-CM

## 2018-10-31 DIAGNOSIS — E871 Hypo-osmolality and hyponatremia: Secondary | ICD-10-CM | POA: Diagnosis not present

## 2018-10-31 DIAGNOSIS — E039 Hypothyroidism, unspecified: Secondary | ICD-10-CM

## 2018-10-31 DIAGNOSIS — E559 Vitamin D deficiency, unspecified: Secondary | ICD-10-CM | POA: Diagnosis not present

## 2018-10-31 DIAGNOSIS — E785 Hyperlipidemia, unspecified: Secondary | ICD-10-CM

## 2018-10-31 NOTE — Progress Notes (Signed)
Patient: Sherry Hammond, Female    DOB: 23-Feb-1961, 57 y.o.   MRN: [id number] Visit Date: 10/31/2018  Today's Provider: Margaretann Loveless, PA-C   Chief Complaint  Patient presents with  . Annual Exam   Subjective:    Annual physical exam Sherry Hammond is a 57 y.o. female who presents today for health maintenance and complete physical. She feels well. She reports exercising no. She reports she is sleeping well.  -----------------------------------------------------------------   Review of Systems  Constitutional: Negative.   HENT: Negative.   Eyes: Negative.   Respiratory: Negative.   Cardiovascular: Negative.   Gastrointestinal: Negative.   Endocrine: Negative.   Genitourinary: Negative.   Musculoskeletal: Negative.   Skin: Negative.   Allergic/Immunologic: Negative.   Neurological: Negative.   Hematological: Negative.   Psychiatric/Behavioral: Negative.     Social History      She  reports that she has never smoked. She has never used smokeless tobacco. She reports that she drinks alcohol. She reports that she does not use drugs.       Social History   Socioeconomic History  . Marital status: Married    Spouse name: Loistine Chance  . Number of children: 4  . Years of education: Bachelor's  . Highest education level: Not on file  Occupational History  . Occupation: Unemployed  Social Needs  . Financial resource strain: Not on file  . Food insecurity:    Worry: Not on file    Inability: Not on file  . Transportation needs:    Medical: Not on file    Non-medical: Not on file  Tobacco Use  . Smoking status: Never Smoker  . Smokeless tobacco: Never Used  Substance and Sexual Activity  . Alcohol use: Yes    Comment: occasional  . Drug use: No  . Sexual activity: Yes  Lifestyle  . Physical activity:    Days per week: Not on file    Minutes per session: Not on file  . Stress: Not on file  Relationships  . Social connections:    Talks on phone:  Not on file    Gets together: Not on file    Attends religious service: Not on file    Active member of club or organization: Not on file    Attends meetings of clubs or organizations: Not on file    Relationship status: Not on file  Other Topics Concern  . Not on file  Social History Narrative  . Not on file    Past Medical History:  Diagnosis Date  . Allergy   . Depression   . Hyperlipidemia   . Thyroid disease      Patient Active Problem List   Diagnosis Date Noted  . Paresthesia 07/22/2015  . Allergic rhinitis 07/12/2015  . Ache in joint 07/12/2015  . Cerumen impaction 07/12/2015  . Hyperlipidemia 07/12/2015  . Hypo-osmolality and hyponatremia 07/12/2015  . Fungal infection of nail 07/12/2015  . Headache, tension-type 07/12/2015  . Adult hypothyroidism 01/30/2009  . Avitaminosis D 01/24/2009  . 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 CESAREAN SECTION      Family History        Family Status  Relation Name Status  . Mother  Alive  . Father  Alive  . Sister  Alive  . Brother  Alive  . Sister  Alive        Her family history  includes Breast cancer (age of onset: 2565) in her mother; Cancer in her mother; Healthy in her brother, sister, and sister; Hyperlipidemia in her father and mother; Hypertension in her father and mother; Kidney disease in her father.      No Known Allergies   Current Outpatient Medications:  .  Cholecalciferol 1000 UNITS capsule, Take 2 capsules by mouth daily., Disp: , Rfl:  .  levothyroxine (SYNTHROID, LEVOTHROID) 88 MCG tablet, Take 1 tablet (88 mcg total) by mouth daily., Disp: 90 tablet, Rfl: 3 .  methylphenidate 36 MG PO CR tablet, Take 1 tablet (36 mg total) by mouth daily., Disp: 90 tablet, Rfl: 0   Patient Care Team: Margaretann LovelessBurnette, Toyna M, PA-C as PCP - General (Family Medicine)      Objective:   Vitals: BP 136/81 (BP Location: Left Arm, Patient Position:  Sitting, Cuff Size: Normal)   Pulse 72   Temp 97.8 F (36.6 C) (Oral)   Resp 16   Ht 5' 7.5" (1.715 m)   Wt 154 lb 3.2 oz (69.9 kg)   SpO2 98%   BMI 23.79 kg/m    Vitals:   10/31/18 0810  BP: 136/81  Pulse: 72  Resp: 16  Temp: 97.8 F (36.6 C)  TempSrc: Oral  SpO2: 98%  Weight: 154 lb 3.2 oz (69.9 kg)  Height: 5' 7.5" (1.715 m)     Physical Exam  Constitutional: She is oriented to person, place, and time. She appears well-developed and well-nourished. No distress.  HENT:  Head: Normocephalic and atraumatic.  Right Ear: Hearing, tympanic membrane, external ear and ear canal normal.  Left Ear: Hearing, tympanic membrane, external ear and ear canal normal.  Nose: Nose normal.  Mouth/Throat: Uvula is midline, oropharynx is clear and moist and mucous membranes are normal. No oropharyngeal exudate.  Eyes: Pupils are equal, round, and reactive to light. Conjunctivae and EOM are normal. Right eye exhibits no discharge. Left eye exhibits no discharge. No scleral icterus.  Neck: Normal range of motion. Neck supple. No JVD present. Carotid bruit is not present. No tracheal deviation present. No thyromegaly present.  Cardiovascular: Normal rate, regular rhythm, normal heart sounds and intact distal pulses. Exam reveals no gallop and no friction rub.  No murmur heard. Pulmonary/Chest: Effort normal and breath sounds normal. No respiratory distress. She has no wheezes. She has no rales. She exhibits no tenderness.  Abdominal: Soft. Bowel sounds are normal. She exhibits no distension and no mass. There is no tenderness. There is no rebound and no guarding.  Musculoskeletal: Normal range of motion. She exhibits no edema or tenderness.  Lymphadenopathy:    She has no cervical adenopathy.  Neurological: She is alert and oriented to person, place, and time.  Skin: Skin is warm and dry. No rash noted. She is not diaphoretic.  Psychiatric: She has a normal mood and affect. Her behavior is  normal. Judgment and thought content normal.  Vitals reviewed.    Depression Screen PHQ 2/9 Scores 10/26/2017 08/31/2017 10/16/2015  PHQ - 2 Score 2 3 0  PHQ- 9 Score 2 7 -      Assessment & Plan:     Routine Health Maintenance and Physical Exam  Exercise Activities and Dietary recommendations Goals   None     Immunization History  Administered Date(s) Administered  . Influenza,inj,Quad PF,6+ Mos 10/05/2014  . Td 11/23/2000  . Tdap 10/05/2014    Health Maintenance  Topic Date Due  . INFLUENZA VACCINE  06/23/2018  . PAP SMEAR  10/23/2019  . MAMMOGRAM  08/22/2020  . TETANUS/TDAP  10/05/2024  . COLONOSCOPY  02/04/2025  . Hepatitis C Screening  Completed  . HIV Screening  Completed     Discussed health benefits of physical activity, and encouraged her to engage in regular exercise appropriate for her age and condition.    1. Annual physical exam Normal physical exam today. Will check labs as below and f/u pending lab results. If labs are stable and WNL she will not need to have these rechecked for one year at her next annual physical exam. She is to call the office in the meantime if she has any acute issue, questions or concerns. - CBC w/Diff/Platelet - Comprehensive Metabolic Panel (CMET) - TSH - HgB A1c - Lipid Profile  2. Adult hypothyroidism Stable on levothyroxine . Will check labs as below and f/u pending results. - TSH  3. Avitaminosis D Stable on 1000 IU daily OTC. Will check labs as below and f/u pending results. - CBC w/Diff/Platelet - Vitamin D (25 hydroxy)  4. Hyperlipidemia, unspecified hyperlipidemia type Diet controlled. Will check labs as below and f/u pending results. - Comprehensive Metabolic Panel (CMET) - Lipid Profile  5. Hypo-osmolality and hyponatremia H/O this. Will check labs as below and f/u pending results. - Comprehensive Metabolic Panel (CMET)  6. Bipolar disorder, in full remission, most recent episode depressed  (HCC) Stable. Will check labs as below and f/u pending results. - HgB A1c  --------------------------------------------------------------------    Margaretann Loveless, PA-C  [facility]

## 2018-10-31 NOTE — Patient Instructions (Signed)

## 2018-11-01 LAB — CBC WITH DIFFERENTIAL/PLATELET
BASOS ABS: 0.1 10*3/uL (ref 0.0–0.2)
BASOS: 1 %
EOS (ABSOLUTE): 0.2 10*3/uL (ref 0.0–0.4)
Eos: 5 %
Hematocrit: 41.4 % (ref 34.0–46.6)
Hemoglobin: 13.5 g/dL (ref 11.1–15.9)
IMMATURE GRANULOCYTES: 0 %
Immature Grans (Abs): 0 10*3/uL (ref 0.0–0.1)
Lymphocytes Absolute: 1.9 10*3/uL (ref 0.7–3.1)
Lymphs: 41 %
MCH: 29.3 pg (ref 26.6–33.0)
MCHC: 32.6 g/dL (ref 31.5–35.7)
MCV: 90 fL (ref 79–97)
MONOS ABS: 0.4 10*3/uL (ref 0.1–0.9)
Monocytes: 8 %
NEUTROS PCT: 45 %
Neutrophils Absolute: 2.1 10*3/uL (ref 1.4–7.0)
PLATELETS: 269 10*3/uL (ref 150–450)
RBC: 4.61 x10E6/uL (ref 3.77–5.28)
RDW: 11.9 % — AB (ref 12.3–15.4)
WBC: 4.7 10*3/uL (ref 3.4–10.8)

## 2018-11-01 LAB — COMPREHENSIVE METABOLIC PANEL
A/G RATIO: 2.4 — AB (ref 1.2–2.2)
ALT: 10 IU/L (ref 0–32)
AST: 20 IU/L (ref 0–40)
Albumin: 4.5 g/dL (ref 3.5–5.5)
Alkaline Phosphatase: 53 IU/L (ref 39–117)
BUN/Creatinine Ratio: 16 (ref 9–23)
BUN: 18 mg/dL (ref 6–24)
Bilirubin Total: 0.4 mg/dL (ref 0.0–1.2)
CALCIUM: 9.3 mg/dL (ref 8.7–10.2)
CHLORIDE: 102 mmol/L (ref 96–106)
CO2: 24 mmol/L (ref 20–29)
Creatinine, Ser: 1.11 mg/dL — ABNORMAL HIGH (ref 0.57–1.00)
GFR calc Af Amer: 65 mL/min/{1.73_m2} (ref >59)
GFR, EST NON AFRICAN AMERICAN: 56 mL/min/{1.73_m2} — AB (ref >59)
GLUCOSE: 90 mg/dL (ref 65–99)
Globulin, Total: 1.9 g/dL (ref 1.5–4.5)
POTASSIUM: 4.2 mmol/L (ref 3.5–5.2)
Sodium: 140 mmol/L (ref 134–144)
Total Protein: 6.4 g/dL (ref 6.0–8.5)

## 2018-11-01 LAB — HEMOGLOBIN A1C
ESTIMATED AVERAGE GLUCOSE: 111 mg/dL
HEMOGLOBIN A1C: 5.5 % (ref 4.8–5.6)

## 2018-11-01 LAB — LIPID PANEL
CHOL/HDL RATIO: 3.1 ratio (ref 0.0–4.4)
Cholesterol, Total: 204 mg/dL — ABNORMAL HIGH (ref 100–199)
HDL: 65 mg/dL (ref >39)
LDL Calculated: 123 mg/dL — ABNORMAL HIGH (ref 0–99)
Triglycerides: 79 mg/dL (ref 0–149)
VLDL Cholesterol Cal: 16 mg/dL (ref 5–40)

## 2018-11-01 LAB — VITAMIN D 25 HYDROXY (VIT D DEFICIENCY, FRACTURES): Vit D, 25-Hydroxy: 43.9 ng/mL (ref 30.0–100.0)

## 2018-11-01 LAB — TSH: TSH: 1.19 u[IU]/mL (ref 0.450–4.500)

## 2018-11-02 ENCOUNTER — Telehealth: Payer: Self-pay

## 2018-11-02 NOTE — Telephone Encounter (Signed)
-----   Message from Margaretann LovelessJennifer M Burnette, PA-C sent at 11/02/2018  9:45 AM EST ----- Blood count is normal. Kidney function decreased slightly. Most likely it is from dehydration from fasting for labs. Can recheck in 2-4 weeks after pushing fluids if desired. Liver enzymes normal. Sugar normal. Thyroid normal. Cholesterol still borderline high and essentially stable from last year, still much improved from 3 years ago. Vit D normal.

## 2018-11-02 NOTE — Telephone Encounter (Signed)
LVMTRC 

## 2018-11-07 NOTE — Telephone Encounter (Signed)
Patient advised as below.  

## 2018-12-06 ENCOUNTER — Other Ambulatory Visit: Payer: BLUE CROSS/BLUE SHIELD

## 2018-12-08 ENCOUNTER — Other Ambulatory Visit: Payer: Self-pay | Admitting: Physician Assistant

## 2018-12-08 DIAGNOSIS — E039 Hypothyroidism, unspecified: Secondary | ICD-10-CM

## 2018-12-15 ENCOUNTER — Other Ambulatory Visit: Payer: Self-pay | Admitting: Physician Assistant

## 2018-12-15 DIAGNOSIS — F901 Attention-deficit hyperactivity disorder, predominantly hyperactive type: Secondary | ICD-10-CM

## 2018-12-15 NOTE — Telephone Encounter (Signed)
Pt needing a refill on:  methylphenidate 36 MG PO CR tablet  Please fill through:  [facility] [place], [place] - [address] [phone] (Phone) [phone] (Fax)   Thanks, [facility]

## 2018-12-15 NOTE — Telephone Encounter (Signed)
Please advise 

## 2018-12-16 MED ORDER — METHYLPHENIDATE HCL ER (OSM) 36 MG PO TBCR: 36.0000 mg | EXTENDED_RELEASE_TABLET | Freq: Every day | ORAL | 0 refills | Status: DC

## 2019-09-11 DIAGNOSIS — Z1231 Encounter for screening mammogram for malignant neoplasm of breast: Secondary | ICD-10-CM | POA: Diagnosis not present

## 2019-09-11 DIAGNOSIS — Z803 Family history of malignant neoplasm of breast: Secondary | ICD-10-CM | POA: Diagnosis not present

## 2019-09-11 LAB — HM MAMMOGRAPHY

## 2019-10-11 ENCOUNTER — Encounter: Payer: Self-pay | Admitting: Physician Assistant

## 2019-11-03 ENCOUNTER — Ambulatory Visit (INDEPENDENT_AMBULATORY_CARE_PROVIDER_SITE_OTHER): Payer: BC Managed Care – PPO | Admitting: Physician Assistant

## 2019-11-03 ENCOUNTER — Encounter: Payer: Self-pay | Admitting: Physician Assistant

## 2019-11-03 ENCOUNTER — Other Ambulatory Visit: Payer: Self-pay

## 2019-11-03 ENCOUNTER — Other Ambulatory Visit (HOSPITAL_COMMUNITY)
Admission: RE | Admit: 2019-11-03 | Discharge: 2019-11-03 | Disposition: A | Discharge status: Home / Self Care | Payer: BC Managed Care – PPO | Source: Ambulatory Visit | Attending: Physician Assistant | Admitting: Physician Assistant

## 2019-11-03 VITALS — BP 120/82 | HR 85 | Temp 96.9°F | Resp 16 | Ht 67.5 in | Wt 156.8 lb

## 2019-11-03 DIAGNOSIS — Z0000 Encounter for general adult medical examination without abnormal findings: Secondary | ICD-10-CM

## 2019-11-03 DIAGNOSIS — E785 Hyperlipidemia, unspecified: Secondary | ICD-10-CM

## 2019-11-03 DIAGNOSIS — E559 Vitamin D deficiency, unspecified: Secondary | ICD-10-CM

## 2019-11-03 DIAGNOSIS — E039 Hypothyroidism, unspecified: Secondary | ICD-10-CM

## 2019-11-03 DIAGNOSIS — Z124 Encounter for screening for malignant neoplasm of cervix: Secondary | ICD-10-CM

## 2019-11-03 DIAGNOSIS — Z126 Encounter for screening for malignant neoplasm of bladder: Secondary | ICD-10-CM | POA: Diagnosis not present

## 2019-11-03 DIAGNOSIS — F901 Attention-deficit hyperactivity disorder, predominantly hyperactive type: Secondary | ICD-10-CM | POA: Diagnosis not present

## 2019-11-03 LAB — POCT URINALYSIS DIPSTICK
Bilirubin, UA: NEGATIVE
Blood, UA: NEGATIVE
Glucose, UA: NEGATIVE
Ketones, UA: NEGATIVE
Leukocytes, UA: NEGATIVE
Nitrite, UA: NEGATIVE
Protein, UA: NEGATIVE
Spec Grav, UA: <=1.005 — AB (ref 1.010–1.025)
Urobilinogen, UA: 0.2 E.U./dL
pH, UA: 6 (ref 5.0–8.0)

## 2019-11-03 MED ORDER — METHYLPHENIDATE HCL ER (OSM) 36 MG PO TBCR: 36.0000 mg | EXTENDED_RELEASE_TABLET | Freq: Every day | ORAL | 0 refills | Status: DC

## 2019-11-03 NOTE — Patient Instructions (Signed)

## 2019-11-03 NOTE — Progress Notes (Signed)
Patient: Sherry Hammond Weight, Female    DOB: 07-20-1961, 58 y.o.   MRN: [id number] Visit Date: 11/03/2019  Today's Provider: Margaretann LovelessJennifer Hammond Kailen Hinkle, PA-C   Chief Complaint  Patient presents with  . Annual Exam   Subjective:     Annual physical exam Sherry Hammond is a 58 y.o. female who presents today for health maintenance and complete physical. She feels well. She reports exercising none. She reports she is sleeping well. ----------------------------------------------------------------- 10/26/2016-Pap is normal, HPV negative. Will repeat in 3-5 years. Mammogram:09/11/2019 additional imaging-Per patient they called her that no additional imaging was no longer needed. Colonoscopy:05/08/2015  Review of Systems  Constitutional: Negative.   HENT: Positive for tinnitus.   Eyes: Negative.   Respiratory: Negative.   Cardiovascular: Negative.   Gastrointestinal: Negative.   Endocrine: Negative.   Genitourinary: Positive for urgency.  Musculoskeletal: Positive for back pain, myalgias and neck pain.  Skin: Negative.   Allergic/Immunologic: Negative.   Neurological: Negative.   Hematological: Negative.   Psychiatric/Behavioral: Negative.     Social History      She  reports that she has never smoked. She has never used smokeless tobacco. She reports current alcohol use. She reports that she does not use drugs.       Social History   Socioeconomic History  . Marital status: Married    Spouse name: Sherry Hammond  . Number of children: 4  . Years of education: Bachelor's  . Highest education level: Not on file  Occupational History  . Occupation: Unemployed  Tobacco Use  . Smoking status: Never Smoker  . Smokeless tobacco: Never Used  Substance and Sexual Activity  . Alcohol use: Yes    Comment: occasional  . Drug use: No  . Sexual activity: Yes  Other Topics Concern  . Not on file  Social History Narrative  . Not on file   Social Determinants of Health   Financial  Resource Strain:   . Difficulty of Paying Living Expenses: Not on file  Food Insecurity:   . Worried About Programme researcher, broadcasting/film/videounning Out of Food in the Last Year: Not on file  . Ran Out of Food in the Last Year: Not on file  Transportation Needs:   . Lack of Transportation (Medical): Not on file  . Lack of Transportation (Non-Medical): Not on file  Physical Activity:   . Days of Exercise per Week: Not on file  . Minutes of Exercise per Session: Not on file  Stress:   . Feeling of Stress : Not on file  Social Connections:   . Frequency of Communication with Friends and Family: Not on file  . Frequency of Social Gatherings with Friends and Family: Not on file  . Attends Religious Services: Not on file  . Active Member of Clubs or Organizations: Not on file  . Attends BankerClub or Organization Meetings: Not on file  . Marital Status: Not on file    Past Medical History:  Diagnosis Date  . Allergy   . Depression   . Hyperlipidemia   . Thyroid disease      Patient Active Problem List   Diagnosis Date Noted  . Paresthesia 07/22/2015  . Allergic rhinitis 07/12/2015  . Ache in joint 07/12/2015  . Cerumen impaction 07/12/2015  . Hyperlipidemia 07/12/2015  . Hypo-osmolality and hyponatremia 07/12/2015  . Fungal infection of nail 07/12/2015  . Headache, tension-type 07/12/2015  . Adult hypothyroidism 01/30/2009  . Avitaminosis D 01/24/2009  . 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 CESAREAN SECTION      Family History        Family Status  Relation Name Status  . Mother  Deceased at age 57       March 02, 2019 from urethreal cancer  . Father  Alive  . Sister  Alive  . Brother  Alive  . Sister  Alive        Her family history includes Breast cancer (age of onset: 38) in her mother; Cancer in her mother; Healthy in her brother, sister, and sister; Hyperlipidemia in her father and mother; Hypertension in her father and mother; Kidney  disease in her father.      No Known Allergies   Current Outpatient Medications:  .  levothyroxine (SYNTHROID, LEVOTHROID) 88 MCG tablet, TAKE 1 TABLET BY MOUTH  DAILY, Disp: 90 tablet, Rfl: 3 .  methylphenidate 36 MG PO CR tablet, Take 1 tablet (36 mg total) by mouth daily., Disp: 90 tablet, Rfl: 0 .  Cholecalciferol 1000 UNITS capsule, Take 2 capsules by mouth daily., Disp: , Rfl:    Patient Care Team: Reine Just as PCP - General (Family Medicine)    Objective:    Vitals: BP 120/82 (BP Location: Left Arm, Patient Position: Sitting, Cuff Size: Large)   Pulse 85   Temp (!) 96.9 F (36.1 C) (Temporal)   Resp 16   Ht 5' 7.5" (1.715 Hammond)   Wt 156 lb 12.8 oz (71.1 kg)   BMI 24.20 kg/Hammond    Vitals:   11/03/19 0902  BP: 120/82  Pulse: 85  Resp: 16  Temp: (!) 96.9 F (36.1 C)  TempSrc: Temporal  Weight: 156 lb 12.8 oz (71.1 kg)  Height: 5' 7.5" (1.715 Hammond)     Physical Exam Vitals reviewed.  Constitutional:      General: She is not in acute distress.    Appearance: Normal appearance. She is well-developed and normal weight. She is not ill-appearing or diaphoretic.  HENT:     Head: Normocephalic and atraumatic.     Right Ear: Hearing, tympanic membrane, ear canal and external ear normal.     Left Ear: Hearing, tympanic membrane, ear canal and external ear normal.     Nose: Nose normal.     Mouth/Throat:     Mouth: Mucous membranes are moist.     Pharynx: Oropharynx is clear. Uvula midline. No oropharyngeal exudate.  Eyes:     General: No scleral icterus.       Right eye: No discharge.        Left eye: No discharge.     Extraocular Movements: Extraocular movements intact.     Conjunctiva/sclera: Conjunctivae normal.     Pupils: Pupils are equal, round, and reactive to light.  Neck:     Thyroid: No thyromegaly.     Vascular: No carotid bruit or JVD.     Trachea: No tracheal deviation.  Cardiovascular:     Rate and Rhythm: Normal rate and regular rhythm.      Pulses: Normal pulses.     Heart sounds: Normal heart sounds. No murmur. No friction rub. No gallop.   Pulmonary:     Effort: Pulmonary effort is normal. No respiratory distress.     Breath sounds: Normal breath sounds. No wheezing or rales.  Chest:     Chest wall: No tenderness.     Breasts: Breasts are symmetrical.   Abdominal:     General: Bowel sounds are  normal. There is no distension.     Palpations: Abdomen is soft. There is no mass.     Tenderness: There is no abdominal tenderness. There is no guarding or rebound.     Hernia: There is no hernia in the left inguinal area.  Genitourinary:    Exam position: Supine.     Labia:        Right: No rash, tenderness, lesion or injury.        Left: No rash, tenderness, lesion or injury.      Vagina: Normal. No signs of injury. No vaginal discharge, erythema, tenderness or bleeding.     Cervix: No cervical motion tenderness, discharge or friability.     Adnexa:        Right: No mass, tenderness or fullness.         Left: No mass, tenderness or fullness.       Rectum: Normal.  Musculoskeletal:        General: No tenderness. Normal range of motion.     Cervical back: Normal range of motion and neck supple.  Lymphadenopathy:     Cervical: No cervical adenopathy.  Skin:    General: Skin is warm and dry.     Findings: No rash.  Neurological:     Mental Status: She is alert and oriented to person, place, and time.     Cranial Nerves: No cranial nerve deficit.     Coordination: Coordination normal.     Deep Tendon Reflexes: Reflexes are normal and symmetric.  Psychiatric:        Mood and Affect: Mood normal.        Behavior: Behavior normal.        Thought Content: Thought content normal.        Judgment: Judgment normal.      Depression Screen PHQ 2/9 Scores 10/31/2018 10/26/2017 08/31/2017 10/16/2015  PHQ - 2 Score 0 2 3 0  PHQ- 9 Score 0 2 7 -       Assessment & Plan:     Routine Health Maintenance and Physical  Exam  Exercise Activities and Dietary recommendations Goals   None     Immunization History  Administered Date(s) Administered  . Influenza,inj,Quad PF,6+ Mos 10/05/2014  . Td 11/23/2000  . Tdap 10/05/2014    Health Maintenance  Topic Date Due  . PAP SMEAR-Modifier  10/23/2019  . INFLUENZA VACCINE  02/21/2020 (Originally 06/24/2019)  . MAMMOGRAM  09/10/2020  . TETANUS/TDAP  10/05/2024  . COLONOSCOPY  02/04/2025  . Hepatitis C Screening  Completed  . HIV Screening  Completed     Discussed health benefits of physical activity, and encouraged her to engage in regular exercise appropriate for her age and condition.   1. Encounter for annual physical exam Normal physical exam today. Will check labs as below and f/u pending lab results. If labs are stable and WNL she will not need to have these rechecked for one year at her next annual physical exam. She is to call the office in the meantime if she has any acute issue, questions or concerns. - CBC w/Diff - Comprehensive Metabolic Panel (CMET) - HgB A1c - TSH - Lipid Profile  2. Encounter for screening for cervical cancer  Pap collected today. Will send as below and f/u pending results. - Cytology - PAP  3. Adult hypothyroidism Stable. Continue Levothyroxine . Will check labs as below and f/u pending results. - CBC w/Diff - Comprehensive Metabolic Panel (CMET) - HgB A1c -  TSH  4. Hyperlipidemia, unspecified hyperlipidemia type Diet controlled. Will check labs as below and f/u pending results. - CBC w/Diff - Comprehensive Metabolic Panel (CMET) - HgB A1c - Lipid Profile  5. Attention deficit hyperactivity disorder (ADHD), predominantly hyperactive type Stable. Diagnosis pulled for medication refill. Continue current medical treatment plan. Will check labs as below and f/u pending results. - CBC w/Diff - methylphenidate 36 MG PO CR tablet; Take 1 tablet (36 mg total) by mouth daily.  Dispense: 90 tablet; Refill: 0   6. Avitaminosis D Will check labs as below and f/u pending results. - CBC w/Diff - Vitamin D (25 hydroxy)  7. Bladder cancer screening UA negative. Mother had ureteral and kidney cancer.  - POCT Urinalysis Dipstick   --------------------------------------------------------------------    Mar Daring, PA-C  [facility]

## 2019-11-04 LAB — COMPREHENSIVE METABOLIC PANEL
ALT: 10 IU/L (ref 0–32)
AST: 18 IU/L (ref 0–40)
Albumin/Globulin Ratio: 1.9 (ref 1.2–2.2)
Albumin: 4.5 g/dL (ref 3.8–4.9)
Alkaline Phosphatase: 64 IU/L (ref 39–117)
BUN/Creatinine Ratio: 14 (ref 9–23)
BUN: 14 mg/dL (ref 6–24)
Bilirubin Total: 0.5 mg/dL (ref 0.0–1.2)
CO2: 22 mmol/L (ref 20–29)
Calcium: 9.6 mg/dL (ref 8.7–10.2)
Chloride: 103 mmol/L (ref 96–106)
Creatinine, Ser: 1.02 mg/dL — ABNORMAL HIGH (ref 0.57–1.00)
GFR calc Af Amer: 71 mL/min/{1.73_m2} (ref >59)
GFR calc non Af Amer: 62 mL/min/{1.73_m2} (ref >59)
Globulin, Total: 2.4 g/dL (ref 1.5–4.5)
Glucose: 89 mg/dL (ref 65–99)
Potassium: 4.7 mmol/L (ref 3.5–5.2)
Sodium: 140 mmol/L (ref 134–144)
Total Protein: 6.9 g/dL (ref 6.0–8.5)

## 2019-11-04 LAB — LIPID PANEL
Chol/HDL Ratio: 3.3 ratio (ref 0.0–4.4)
Cholesterol, Total: 219 mg/dL — ABNORMAL HIGH (ref 100–199)
HDL: 66 mg/dL (ref >39)
LDL Chol Calc (NIH): 143 mg/dL — ABNORMAL HIGH (ref 0–99)
Triglycerides: 55 mg/dL (ref 0–149)
VLDL Cholesterol Cal: 10 mg/dL (ref 5–40)

## 2019-11-04 LAB — CBC WITH DIFFERENTIAL/PLATELET
Basophils Absolute: 0.1 10*3/uL (ref 0.0–0.2)
Basos: 2 %
EOS (ABSOLUTE): 0.3 10*3/uL (ref 0.0–0.4)
Eos: 6 %
Hematocrit: 42.3 % (ref 34.0–46.6)
Hemoglobin: 14 g/dL (ref 11.1–15.9)
Immature Grans (Abs): 0 10*3/uL (ref 0.0–0.1)
Immature Granulocytes: 0 %
Lymphocytes Absolute: 1.5 10*3/uL (ref 0.7–3.1)
Lymphs: 34 %
MCH: 30.2 pg (ref 26.6–33.0)
MCHC: 33.1 g/dL (ref 31.5–35.7)
MCV: 91 fL (ref 79–97)
Monocytes Absolute: 0.3 10*3/uL (ref 0.1–0.9)
Monocytes: 7 %
Neutrophils Absolute: 2.2 10*3/uL (ref 1.4–7.0)
Neutrophils: 51 %
Platelets: 238 10*3/uL (ref 150–450)
RBC: 4.63 x10E6/uL (ref 3.77–5.28)
RDW: 11.8 % (ref 11.7–15.4)
WBC: 4.4 10*3/uL (ref 3.4–10.8)

## 2019-11-04 LAB — HEMOGLOBIN A1C
Est. average glucose Bld gHb Est-mCnc: 108 mg/dL
Hgb A1c MFr Bld: 5.4 % (ref 4.8–5.6)

## 2019-11-04 LAB — VITAMIN D 25 HYDROXY (VIT D DEFICIENCY, FRACTURES): Vit D, 25-Hydroxy: 30.4 ng/mL (ref 30.0–100.0)

## 2019-11-04 LAB — TSH: TSH: 0.503 u[IU]/mL (ref 0.450–4.500)

## 2019-11-06 LAB — CYTOLOGY - PAP
Comment: NEGATIVE
Diagnosis: NEGATIVE
High risk HPV: NEGATIVE

## 2019-11-09 ENCOUNTER — Other Ambulatory Visit: Payer: Self-pay | Admitting: Physician Assistant

## 2019-11-09 DIAGNOSIS — E039 Hypothyroidism, unspecified: Secondary | ICD-10-CM

## 2020-02-02 ENCOUNTER — Other Ambulatory Visit: Payer: Self-pay | Admitting: Physician Assistant

## 2020-02-02 DIAGNOSIS — E039 Hypothyroidism, unspecified: Secondary | ICD-10-CM

## 2020-02-02 MED ORDER — LEVOTHYROXINE SODIUM 88 MCG PO TABS: 88.0000 ug | ORAL_TABLET | Freq: Every day | ORAL | 3 refills | Status: DC

## 2020-02-02 NOTE — Progress Notes (Signed)
Levothyroxine refilled

## 2020-11-04 ENCOUNTER — Other Ambulatory Visit: Payer: Self-pay

## 2020-11-04 ENCOUNTER — Encounter: Payer: Self-pay | Admitting: Physician Assistant

## 2020-11-04 ENCOUNTER — Ambulatory Visit (INDEPENDENT_AMBULATORY_CARE_PROVIDER_SITE_OTHER): Payer: BC Managed Care – PPO | Admitting: Physician Assistant

## 2020-11-04 VITALS — BP 129/82 | HR 75 | Temp 97.7°F | Ht 67.5 in | Wt 158.0 lb

## 2020-11-04 DIAGNOSIS — Z0000 Encounter for general adult medical examination without abnormal findings: Secondary | ICD-10-CM

## 2020-11-04 DIAGNOSIS — E782 Mixed hyperlipidemia: Secondary | ICD-10-CM

## 2020-11-04 DIAGNOSIS — E039 Hypothyroidism, unspecified: Secondary | ICD-10-CM

## 2020-11-04 DIAGNOSIS — F3176 Bipolar disorder, in full remission, most recent episode depressed: Secondary | ICD-10-CM | POA: Diagnosis not present

## 2020-11-04 DIAGNOSIS — E559 Vitamin D deficiency, unspecified: Secondary | ICD-10-CM

## 2020-11-04 DIAGNOSIS — Z1239 Encounter for other screening for malignant neoplasm of breast: Secondary | ICD-10-CM | POA: Diagnosis not present

## 2020-11-04 NOTE — Patient Instructions (Addendum)
[facility] [address] [place],  [place]  [address] Main: [phone]   Preventive Care 40-57 Years Old, Female Preventive care refers to visits with your health care provider and lifestyle choices that can promote health and wellness. This includes:  A yearly physical exam. This may also be called an annual well check.  Regular dental visits and eye exams.  Immunizations.  Screening for certain conditions.  Healthy lifestyle choices, such as eating a healthy diet, getting regular exercise, not using drugs or products that contain nicotine and tobacco, and limiting alcohol use. What can I expect for my preventive care visit? Physical exam Your health care provider will check your:  Height and weight. This may be used to calculate body mass index (BMI), which tells if you are at a healthy weight.  Heart rate and blood pressure.  Skin for abnormal spots. Counseling Your health care provider may ask you questions about your:  Alcohol, tobacco, and drug use.  Emotional well-being.  Home and relationship well-being.  Sexual activity.  Eating habits.  Work and work environment.  Method of birth control.  Menstrual cycle.  Pregnancy history. What immunizations do I need?  Influenza (flu) vaccine  This is recommended every year. Tetanus, diphtheria, and pertussis (Tdap) vaccine  You may need a Td booster every 10 years. Varicella (chickenpox) vaccine  You may need this if you have not been vaccinated. Zoster (shingles) vaccine  You may need this after age 60. Measles, mumps, and rubella (MMR) vaccine  You may need at least one dose of MMR if you were born in 1957 or later. You may also need a second dose. Pneumococcal conjugate (PCV13) vaccine  You may need this if you have certain conditions and were not previously vaccinated. Pneumococcal polysaccharide (PPSV23) vaccine  You may need one or two doses if you smoke  cigarettes or if you have certain conditions. Meningococcal conjugate (MenACWY) vaccine  You may need this if you have certain conditions. Hepatitis A vaccine  You may need this if you have certain conditions or if you travel or work in places where you may be exposed to hepatitis A. Hepatitis B vaccine  You may need this if you have certain conditions or if you travel or work in places where you may be exposed to hepatitis B. Haemophilus influenzae type b (Hib) vaccine  You may need this if you have certain conditions. Human papillomavirus (HPV) vaccine  If recommended by your health care provider, you may need three doses over 6 months. You may receive vaccines as individual doses or as more than one vaccine together in one shot (combination vaccines). Talk with your health care provider about the risks and benefits of combination vaccines. What tests do I need? Blood tests  Lipid and cholesterol levels. These may be checked every 5 years, or more frequently if you are over 50 years old.  Hepatitis C test.  Hepatitis B test. Screening  Lung cancer screening. You may have this screening every year starting at age 55 if you have a 30-pack-year history of smoking and currently smoke or have quit within the past 15 years.  Colorectal cancer screening. All adults should have this screening starting at age 50 and continuing until age 75. Your health care provider may recommend screening at age 45 if you are at increased risk. You will have tests every 1-10 years, depending on your results and the type of screening test.  Diabetes screening. This is   done by checking your blood sugar (glucose) after you have not eaten for a while (fasting). You may have this done every 1-3 years.  Mammogram. This may be done every 1-2 years. Talk with your health care provider about when you should start having regular mammograms. This may depend on whether you have a family history of breast  cancer.  BRCA-related cancer screening. This may be done if you have a family history of breast, ovarian, tubal, or peritoneal cancers.  Pelvic exam and Pap test. This may be done every 3 years starting at age 73. Starting at age 37, this may be done every 5 years if you have a Pap test in combination with an HPV test. Other tests  Sexually transmitted disease (STD) testing.  Bone density scan. This is done to screen for osteoporosis. You may have this scan if you are at high risk for osteoporosis. Follow these instructions at home: Eating and drinking  Eat a diet that includes fresh fruits and vegetables, whole grains, lean protein, and low-fat dairy.  Take vitamin and mineral supplements as recommended by your health care provider.  Do not drink alcohol if: ? Your health care provider tells you not to drink. ? You are pregnant, may be pregnant, or are planning to become pregnant.  If you drink alcohol: ? Limit how much you have to 0-1 drink a day. ? Be aware of how much alcohol is in your drink. In the [place] one drink equals one 12 oz bottle of beer (355 mL), one 5 oz glass of wine (148 mL), or one 1 oz glass of hard liquor (44 mL). Lifestyle  Take daily care of your teeth and gums.  Stay active. Exercise for at least 30 minutes on 5 or more days each week.  Do not use any products that contain nicotine or tobacco, such as cigarettes, e-cigarettes, and chewing tobacco. If you need help quitting, ask your health care provider.  If you are sexually active, practice safe sex. Use a condom or other form of birth control (contraception) in order to prevent pregnancy and STIs (sexually transmitted infections).  If told by your health care provider, take low-dose aspirin daily starting at age 62. What's next?  Visit your health care provider once a year for a well check visit.  Ask your health care provider how often you should have your eyes and teeth checked.  Stay up to date  on all vaccines. This information is not intended to replace advice given to you by your health care provider. Make sure you discuss any questions you have with your health care provider. Document Revised: 07/21/2018 Document Reviewed: 07/21/2018 Elsevier Patient Education  2020 [employer].

## 2020-11-04 NOTE — Progress Notes (Signed)
Complete physical exam   Patient: Sherry Hammond   DOB: 21-Aug-1961   59 y.o. Female  MRN: [id number] Visit Date: 11/04/2020  Today's healthcare provider: Margaretann Loveless, PA-C   Chief Complaint  Patient presents with  . Annual Exam   Subjective    Sherry Hammond is a 59 y.o. female who presents today for a complete physical exam.  She reports consuming a general diet. The patient does not participate in regular exercise at present. She generally feels well. She reports sleeping well. She does not have additional problems to discuss today.   HPI    Past Medical History:  Diagnosis Date  . Allergy   . Depression   . Hyperlipidemia   . Thyroid disease    Past Surgical History:  Procedure Laterality Date  . CESAREAN SECTION     Social History   Socioeconomic History  . Marital status: Married    Spouse name: Loistine Chance  . Number of children: 4  . Years of education: Bachelor's  . Highest education level: Not on file  Occupational History  . Occupation: Unemployed  Tobacco Use  . Smoking status: Never Smoker  . Smokeless tobacco: Never Used  Vaping Use  . Vaping Use: Never used  Substance and Sexual Activity  . Alcohol use: Yes    Comment: occasional  . Drug use: No  . Sexual activity: Yes  Other Topics Concern  . Not on file  Social History Narrative  . Not on file   Social Determinants of Health   Financial Resource Strain: Not on file  Food Insecurity: Not on file  Transportation Needs: Not on file  Physical Activity: Not on file  Stress: Not on file  Social Connections: Not on file  Intimate Partner Violence: Not on file   Family Status  Relation Name Status  . Mother  Deceased at age 74       03/08/2019 from urethreal cancer  . Father  Alive  . Sister  Alive  . Brother  Alive  . Sister  Alive   Family History  Problem Relation Age of Onset  . Hyperlipidemia Mother   . Hypertension Mother   . Cancer Mother        BREAST  .  Breast cancer Mother 60  . Hyperlipidemia Father   . Hypertension Father   . Kidney disease Father   . Healthy Sister   . Healthy Brother   . Healthy Sister    No Known Allergies  Patient Care Team: Margaretann Loveless, PA-C as PCP - General (Family Medicine)   Medications: Outpatient Medications Prior to Visit  Medication Sig  . levothyroxine (SYNTHROID) 88 MCG tablet Take 1 tablet (88 mcg total) by mouth daily.  . methylphenidate 36 MG PO CR tablet Take 1 tablet (36 mg total) by mouth daily.  . Cholecalciferol 1000 UNITS capsule Take 2 capsules by mouth daily.   No facility-administered medications prior to visit.    Review of Systems  Constitutional: Negative.   HENT: Negative.   Eyes: Negative.   Respiratory: Negative.   Cardiovascular: Negative.   Gastrointestinal: Negative.   Endocrine: Negative.   Genitourinary: Negative.   Musculoskeletal: Positive for myalgias, neck pain and neck stiffness. Negative for arthralgias, back pain, gait problem and joint swelling.  Skin: Negative.   Allergic/Immunologic: Negative.   Neurological: Negative.   Hematological: Negative.   Psychiatric/Behavioral: Negative.       Objective    BP 129/82 (BP Location:  Left Arm, Patient Position: Sitting, Cuff Size: Large)   Pulse 75   Temp 97.7 F (36.5 C) (Oral)   Ht 5' 7.5" (1.715 m)   Wt 158 lb (71.7 kg)   BMI 24.38 kg/m    Physical Exam Vitals reviewed.  Constitutional:      General: She is not in acute distress.    Appearance: Normal appearance. She is well-developed, normal weight and well-nourished. She is not ill-appearing or diaphoretic.  HENT:     Head: Normocephalic and atraumatic.     Right Ear: Tympanic membrane, ear canal and external ear normal.     Left Ear: Tympanic membrane, ear canal and external ear normal.     Nose: Nose normal.     Mouth/Throat:     Mouth: Oropharynx is clear and moist. Mucous membranes are moist.     Pharynx: Oropharynx is clear. No  oropharyngeal exudate.  Eyes:     General: No scleral icterus.       Right eye: No discharge.        Left eye: No discharge.     Extraocular Movements: Extraocular movements intact and EOM normal.     Conjunctiva/sclera: Conjunctivae normal.     Pupils: Pupils are equal, round, and reactive to light.  Neck:     Thyroid: No thyromegaly.     Vascular: No carotid bruit or JVD.     Trachea: No tracheal deviation.  Cardiovascular:     Rate and Rhythm: Normal rate and regular rhythm.     Pulses: Normal pulses and intact distal pulses.     Heart sounds: Normal heart sounds. No murmur heard. No friction rub. No gallop.   Pulmonary:     Effort: Pulmonary effort is normal. No respiratory distress.     Breath sounds: Normal breath sounds. No wheezing or rales.  Chest:     Chest wall: No tenderness.  Abdominal:     General: Bowel sounds are normal. There is no distension.     Palpations: Abdomen is soft. There is no mass.     Tenderness: There is no abdominal tenderness. There is no guarding or rebound.  Musculoskeletal:        General: No tenderness. Normal range of motion.     Cervical back: Normal range of motion and neck supple. No tenderness.     Right lower leg: No edema.     Left lower leg: No edema.  Lymphadenopathy:     Cervical: No cervical adenopathy.  Skin:    General: Skin is warm and dry.     Capillary Refill: Capillary refill takes less than 2 seconds.     Findings: No rash.  Neurological:     General: No focal deficit present.     Mental Status: She is alert and oriented to person, place, and time. Mental status is at baseline.  Psychiatric:        Mood and Affect: Mood and affect and mood normal.        Behavior: Behavior normal.        Thought Content: Thought content normal.        Judgment: Judgment normal.     Last depression screening scores PHQ 2/9 Scores 10/31/2018 10/26/2017 08/31/2017  PHQ - 2 Score 0 2 3  PHQ- 9 Score 0 2 7   Last fall risk  screening Fall Risk  11/03/2019  Falls in the past year? 0  Number falls in past yr: 0  Injury with Fall? 0  Follow up Falls evaluation completed   Last Audit-C alcohol use screening Alcohol Use Disorder Test (AUDIT) 11/03/2019  1. How often do you have a drink containing alcohol? 1  2. How many drinks containing alcohol do you have on a typical day when you are drinking? 0  3. How often do you have six or more drinks on one occasion? 0  AUDIT-C Score 1  Alcohol Brief Interventions/Follow-up AUDIT Score <7 follow-up not indicated   A score of 3 or more in women, and 4 or more in men indicates increased risk for alcohol abuse, EXCEPT if all of the points are from question 1   No results found for any visits on 11/04/20.  Assessment & Plan    Routine Health Maintenance and Physical Exam  Exercise Activities and Dietary recommendations Goals   None     Immunization History  Administered Date(s) Administered  . Influenza,inj,Quad PF,6+ Mos 10/05/2014  . Td 11/23/2000  . Tdap 10/05/2014    Health Maintenance  Topic Date Due  . COVID-19 Vaccine (1) Never done  . MAMMOGRAM  09/10/2020  . INFLUENZA VACCINE  02/20/2021 (Originally 06/23/2020)  . PAP SMEAR-Modifier  11/02/2022  . TETANUS/TDAP  10/05/2024  . COLONOSCOPY  02/04/2025  . Hepatitis C Screening  Completed  . HIV Screening  Completed    Discussed health benefits of physical activity, and encouraged her to engage in regular exercise appropriate for her age and condition.  1. Annual physical exam Normal physical exam today. Will check labs as below and f/u pending lab results. If labs are stable and WNL she will not need to have these rechecked for one year at her next annual physical exam. She is to call the office in the meantime if she has any acute issue, questions or concerns.  2. Encounter for breast cancer screening using non-mammogram modality Breast exam today was normal. There is no family history of breast  cancer. She does perform regular self breast exams. Mammogram was ordered as below. Information for [facility] clinic was given to patient so she may schedule her mammogram at her convenience. - MM 3D SCREEN BREAST BILATERAL; Future  3. Bipolar disorder, in full remission, most recent episode depressed (HCC) Stable. Will check labs as below and f/u pending results. - CBC w/Diff/Platelet - Comprehensive Metabolic Panel (CMET) - Lipid Panel With LDL/HDL Ratio  4. Adult hypothyroidism Stable on levothyroxine . Will check labs as below and f/u pending results. - CBC w/Diff/Platelet - TSH  5. Avitaminosis D H/O this and postmenopausal. Will check labs as below and f/u pending results. - CBC w/Diff/Platelet - Vitamin D (25 hydroxy)  6. Moderate mixed hyperlipidemia not requiring statin therapy Diet controlled. Will check labs as below and f/u pending results. - CBC w/Diff/Platelet - Comprehensive Metabolic Panel (CMET) - Lipid Panel With LDL/HDL Ratio   No follow-ups on file.     Delmer Islam, PA-C, have reviewed all documentation for this visit. The documentation on 11/04/20 for the exam, diagnosis, procedures, and orders are all accurate and complete.   Reine Just  [facility] [phone] (phone) [phone] (fax)  [facility]

## 2020-11-05 LAB — COMPREHENSIVE METABOLIC PANEL
ALT: 11 IU/L (ref 0–32)
AST: 18 IU/L (ref 0–40)
Albumin/Globulin Ratio: 2 (ref 1.2–2.2)
Albumin: 4.6 g/dL (ref 3.8–4.9)
Alkaline Phosphatase: 60 IU/L (ref 44–121)
BUN/Creatinine Ratio: 21 (ref 9–23)
BUN: 18 mg/dL (ref 6–24)
Bilirubin Total: 0.4 mg/dL (ref 0.0–1.2)
CO2: 20 mmol/L (ref 20–29)
Calcium: 9.5 mg/dL (ref 8.7–10.2)
Chloride: 104 mmol/L (ref 96–106)
Creatinine, Ser: 0.87 mg/dL (ref 0.57–1.00)
GFR calc Af Amer: 86 mL/min/{1.73_m2} (ref >59)
GFR calc non Af Amer: 74 mL/min/{1.73_m2} (ref >59)
Globulin, Total: 2.3 g/dL (ref 1.5–4.5)
Glucose: 102 mg/dL — ABNORMAL HIGH (ref 65–99)
Potassium: 4.5 mmol/L (ref 3.5–5.2)
Sodium: 138 mmol/L (ref 134–144)
Total Protein: 6.9 g/dL (ref 6.0–8.5)

## 2020-11-05 LAB — CBC WITH DIFFERENTIAL/PLATELET
Basophils Absolute: 0.1 10*3/uL (ref 0.0–0.2)
Basos: 2 %
EOS (ABSOLUTE): 0.2 10*3/uL (ref 0.0–0.4)
Eos: 5 %
Hematocrit: 39.8 % (ref 34.0–46.6)
Hemoglobin: 13.5 g/dL (ref 11.1–15.9)
Immature Grans (Abs): 0 10*3/uL (ref 0.0–0.1)
Immature Granulocytes: 0 %
Lymphocytes Absolute: 1.9 10*3/uL (ref 0.7–3.1)
Lymphs: 40 %
MCH: 30.1 pg (ref 26.6–33.0)
MCHC: 33.9 g/dL (ref 31.5–35.7)
MCV: 89 fL (ref 79–97)
Monocytes Absolute: 0.4 10*3/uL (ref 0.1–0.9)
Monocytes: 9 %
Neutrophils Absolute: 2.1 10*3/uL (ref 1.4–7.0)
Neutrophils: 44 %
Platelets: 255 10*3/uL (ref 150–450)
RBC: 4.48 x10E6/uL (ref 3.77–5.28)
RDW: 11.5 % — ABNORMAL LOW (ref 11.7–15.4)
WBC: 4.7 10*3/uL (ref 3.4–10.8)

## 2020-11-05 LAB — LIPID PANEL WITH LDL/HDL RATIO
Cholesterol, Total: 212 mg/dL — ABNORMAL HIGH (ref 100–199)
HDL: 64 mg/dL (ref >39)
LDL Chol Calc (NIH): 138 mg/dL — ABNORMAL HIGH (ref 0–99)
LDL/HDL Ratio: 2.2 ratio (ref 0.0–3.2)
Triglycerides: 57 mg/dL (ref 0–149)
VLDL Cholesterol Cal: 10 mg/dL (ref 5–40)

## 2020-11-05 LAB — TSH: TSH: 0.146 u[IU]/mL — ABNORMAL LOW (ref 0.450–4.500)

## 2020-11-05 LAB — VITAMIN D 25 HYDROXY (VIT D DEFICIENCY, FRACTURES): Vit D, 25-Hydroxy: 32.9 ng/mL (ref 30.0–100.0)

## 2020-11-06 ENCOUNTER — Telehealth: Payer: Self-pay

## 2020-11-06 DIAGNOSIS — E039 Hypothyroidism, unspecified: Secondary | ICD-10-CM

## 2020-11-06 NOTE — Telephone Encounter (Signed)
Seen by patient Sherry Hammond on 11/05/2020 2:37 PM

## 2020-11-06 NOTE — Telephone Encounter (Signed)
-----   Message from Margaretann Loveless, [place] sent at 11/05/2020  9:00 AM EST ----- Blood count is normal. Kidney and liver function is normal. Sodium, potassium, and calcium are normal. Thyroid is slightly overcorrected. I wouldn't change your dose yet, lets recheck in 6 weeks and see if still slightly overcorrected. Cholesterol still borderline elevated, but is improved from last year. Vit D is normal.

## 2020-11-26 DIAGNOSIS — J208 Acute bronchitis due to other specified organisms: Secondary | ICD-10-CM | POA: Diagnosis not present

## 2020-11-26 DIAGNOSIS — U071 COVID-19: Secondary | ICD-10-CM | POA: Diagnosis not present

## 2021-01-14 ENCOUNTER — Other Ambulatory Visit: Payer: Self-pay | Admitting: Physician Assistant

## 2021-01-14 DIAGNOSIS — E039 Hypothyroidism, unspecified: Secondary | ICD-10-CM

## 2021-07-06 ENCOUNTER — Other Ambulatory Visit: Payer: Self-pay | Admitting: Physician Assistant

## 2021-07-06 DIAGNOSIS — E039 Hypothyroidism, unspecified: Secondary | ICD-10-CM

## 2021-11-03 ENCOUNTER — Encounter: Payer: Self-pay | Admitting: Family Medicine

## 2021-11-03 ENCOUNTER — Ambulatory Visit: Payer: BC Managed Care – PPO | Admitting: Family Medicine

## 2021-11-03 ENCOUNTER — Other Ambulatory Visit: Payer: Self-pay

## 2021-11-03 VITALS — BP 116/81 | HR 63 | Temp 97.9°F | Ht 67.5 in | Wt 160.9 lb

## 2021-11-03 DIAGNOSIS — F419 Anxiety disorder, unspecified: Secondary | ICD-10-CM | POA: Diagnosis not present

## 2021-11-03 DIAGNOSIS — F3178 Bipolar disorder, in full remission, most recent episode mixed: Secondary | ICD-10-CM | POA: Diagnosis not present

## 2021-11-03 DIAGNOSIS — W19XXXD Unspecified fall, subsequent encounter: Secondary | ICD-10-CM | POA: Diagnosis not present

## 2021-11-03 DIAGNOSIS — E039 Hypothyroidism, unspecified: Secondary | ICD-10-CM | POA: Diagnosis not present

## 2021-11-03 DIAGNOSIS — W19XXXA Unspecified fall, initial encounter: Secondary | ICD-10-CM | POA: Insufficient documentation

## 2021-11-03 MED ORDER — LEVOTHYROXINE SODIUM 88 MCG PO TABS: 88.0000 ug | ORAL_TABLET | Freq: Every day | ORAL | 3 refills | Status: DC

## 2021-11-03 NOTE — Assessment & Plan Note (Signed)
Manifested in behavior; continue to monitor Denies concerns Has been praying more
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
## 2021-11-03 NOTE — Assessment & Plan Note (Signed)
Reports feeling stable; denies blood work request today Wishes to wait until CPE for labs No problems taking medication Denies excess fatigue, weight gain/loss, irritability, hair loss

## 2021-11-03 NOTE — Assessment & Plan Note (Signed)
Manifested in behavior, as anxiety; continue to monitor Denies concerns Has been praying more

## 2022-03-24 ENCOUNTER — Encounter: Payer: BC Managed Care – PPO | Admitting: Family Medicine

## 2022-04-08 NOTE — Progress Notes (Signed)
I,Roshena L Chambers,acting as a scribe for Jacky Kindle, FNP.,have documented all relevant documentation on the behalf of Jacky Kindle, FNP,as directed by  Jacky Kindle, FNP while in the presence of Jacky Kindle, FNP.   Complete physical exam   Patient: Sherry Hammond   DOB: 1961/10/14   61 y.o. Female  MRN: [id number] Visit Date: 04/09/2022  Today's healthcare provider: Jacky Kindle, FNP  Re Introduced to nurse practitioner role and practice setting.  All questions answered.  Discussed provider/patient relationship and expectations.   Chief Complaint  Patient presents with   Annual Exam   Subjective    Sherry Hammond is a 61 y.o. female who presents today for a complete physical exam.  She reports consuming a general diet. The patient does not participate in regular exercise at present. She generally feels fairly well. She reports sleeping fairly well. She does have additional problems to discuss today.  HPI  11/12/19-Pap is normal, HPV negative.  Will repeat in 5 years  Past Medical History:  Diagnosis Date   Allergy    Depression    Hyperlipidemia    Thyroid disease    Past Surgical History:  Procedure Laterality Date   CESAREAN SECTION     Social History   Socioeconomic History   Marital status: Married    Spouse name: Loistine Chance   Number of children: 4   Years of education: Bachelor's   Highest education level: Not on file  Occupational History   Occupation: Unemployed  Tobacco Use   Smoking status: Never   Smokeless tobacco: Never  Vaping Use   Vaping Use: Never used  Substance and Sexual Activity   Alcohol use: Yes    Comment: occasional   Drug use: No   Sexual activity: Yes  Other Topics Concern   Not on file  Social History Narrative   Not on file   Social Determinants of Health   Financial Resource Strain: Not on file  Food Insecurity: Not on file  Transportation Needs: Not on file  Physical Activity: Not on file  Stress: Not on  file  Social Connections: Not on file  Intimate Partner Violence: Not on file   Family Status  Relation Name Status   Mother  Deceased at age 44       2019-02-28 from urethreal cancer   Father  Alive   Sister  Alive   Brother  Alive   Sister  Alive   Family History  Problem Relation Age of Onset   Hyperlipidemia Mother    Hypertension Mother    Cancer Mother        BREAST   Breast cancer Mother 35   Hyperlipidemia Father    Hypertension Father    Kidney disease Father    Healthy Sister    Healthy Brother    Healthy Sister    No Known Allergies  Patient Care Team: Merita Norton T, FNP as PCP - General (Family Medicine)   Medications: Outpatient Medications Prior to Visit  Medication Sig   levothyroxine (SYNTHROID) 88 MCG tablet Take 1 tablet (88 mcg total) by mouth daily.   No facility-administered medications prior to visit.    Review of Systems  Constitutional:  Negative for chills, fatigue and fever.  HENT:  Negative for congestion, ear pain, rhinorrhea, sneezing and sore throat.   Eyes: Negative.  Negative for pain and redness.  Respiratory:  Negative for cough, shortness of breath and wheezing.   Cardiovascular:  Negative for chest pain and leg swelling.  Gastrointestinal:  Negative for abdominal pain, blood in stool, constipation, diarrhea and nausea.  Endocrine: Negative for polydipsia and polyphagia.  Genitourinary:  Positive for urgency. Negative for dysuria, flank pain, hematuria, pelvic pain, vaginal bleeding and vaginal discharge.  Musculoskeletal:  Positive for joint swelling and myalgias. Negative for arthralgias, back pain and gait problem.  Skin:  Negative for rash.  Allergic/Immunologic: Positive for environmental allergies.  Neurological: Negative.  Negative for dizziness, tremors, seizures, weakness, light-headedness, numbness and headaches.  Hematological:  Negative for adenopathy.  Psychiatric/Behavioral: Negative.  Negative for behavioral  problems, confusion and dysphoric mood. The patient is not nervous/anxious and is not hyperactive.      Objective     BP 114/84 (BP Location: Right Arm, Patient Position: Sitting, Cuff Size: Normal)   Pulse 88   Temp (!) 97.5 F (36.4 C) (Oral)   Resp 14   Ht 5' 7.5" (1.715 m)   Wt 153 lb (69.4 kg)   SpO2 100% Comment: room air  BMI 23.61 kg/m     Physical Exam Vitals and nursing note reviewed.  Constitutional:      General: She is awake. She is not in acute distress.    Appearance: Normal appearance. She is well-developed, well-groomed and normal weight. She is not ill-appearing, toxic-appearing or diaphoretic.  HENT:     Head: Normocephalic and atraumatic.     Jaw: There is normal jaw occlusion. No trismus, tenderness, swelling or pain on movement.     Right Ear: Hearing, tympanic membrane, ear canal and external ear normal. There is no impacted cerumen.     Left Ear: Hearing, tympanic membrane, ear canal and external ear normal. There is no impacted cerumen.     Nose: Nose normal. No congestion or rhinorrhea.     Right Turbinates: Not enlarged, swollen or pale.     Left Turbinates: Not enlarged, swollen or pale.     Right Sinus: No maxillary sinus tenderness or frontal sinus tenderness.     Left Sinus: No maxillary sinus tenderness or frontal sinus tenderness.     Mouth/Throat:     Lips: Pink.     Mouth: Mucous membranes are moist. No injury.     Tongue: No lesions.     Pharynx: Oropharynx is clear. Uvula midline. No pharyngeal swelling, oropharyngeal exudate, posterior oropharyngeal erythema or uvula swelling.     Tonsils: No tonsillar exudate or tonsillar abscesses.  Eyes:     General: Lids are normal. Lids are everted, no foreign bodies appreciated. Vision grossly intact. Gaze aligned appropriately. No allergic shiner or visual field deficit.       Right eye: No discharge.        Left eye: No discharge.     Extraocular Movements: Extraocular movements intact.      Conjunctiva/sclera: Conjunctivae normal.     Right eye: Right conjunctiva is not injected. No exudate.    Left eye: Left conjunctiva is not injected. No exudate.    Pupils: Pupils are equal, round, and reactive to light.  Neck:     Thyroid: No thyroid mass, thyromegaly or thyroid tenderness.     Vascular: No carotid bruit.     Trachea: Trachea normal.  Cardiovascular:     Rate and Rhythm: Normal rate and regular rhythm.     Pulses: Normal pulses.          Carotid pulses are 2+ on the right side and 2+ on the left side.  Radial pulses are 2+ on the right side and 2+ on the left side.       Dorsalis pedis pulses are 2+ on the right side and 2+ on the left side.       Posterior tibial pulses are 2+ on the right side and 2+ on the left side.     Heart sounds: Normal heart sounds, S1 normal and S2 normal. No murmur heard.   No friction rub. No gallop.  Pulmonary:     Effort: Pulmonary effort is normal. No respiratory distress.     Breath sounds: Normal breath sounds and air entry. No stridor. No wheezing, rhonchi or rales.  Chest:     Chest wall: No tenderness.  Abdominal:     General: Abdomen is flat. Bowel sounds are normal. There is no distension.     Palpations: Abdomen is soft. There is no mass.     Tenderness: There is no abdominal tenderness. There is no right CVA tenderness, left CVA tenderness, guarding or rebound.     Hernia: No hernia is present.  Genitourinary:    Comments: Exam deferred; denies complaints Musculoskeletal:        General: No swelling, tenderness, deformity or signs of injury. Normal range of motion.     Cervical back: Full passive range of motion without pain, normal range of motion and neck supple. No edema, rigidity or tenderness. No muscular tenderness.     Right lower leg: No edema.     Left lower leg: No edema.  Lymphadenopathy:     Cervical: No cervical adenopathy.     Right cervical: No superficial, deep or posterior cervical adenopathy.    Left  cervical: No superficial, deep or posterior cervical adenopathy.  Skin:    General: Skin is warm and dry.     Capillary Refill: Capillary refill takes less than 2 seconds.     Coloration: Skin is not jaundiced or pale.     Findings: No bruising, erythema, lesion or rash.  Neurological:     General: No focal deficit present.     Mental Status: She is alert and oriented to person, place, and time. Mental status is at baseline.     GCS: GCS eye subscore is 4. GCS verbal subscore is 5. GCS motor subscore is 6.     Sensory: Sensation is intact. No sensory deficit.     Motor: Motor function is intact. No weakness.     Coordination: Coordination is intact. Coordination normal.     Gait: Gait is intact. Gait normal.  Psychiatric:        Attention and Perception: Attention and perception normal.        Mood and Affect: Mood and affect normal.        Speech: Speech normal.        Behavior: Behavior normal. Behavior is cooperative.        Thought Content: Thought content normal.        Cognition and Memory: Cognition and memory normal.        Judgment: Judgment normal.     Last depression screening scores    04/09/2022    9:15 AM 11/03/2021    2:30 PM 11/04/2020   10:23 AM  PHQ 2/9 Scores  PHQ - 2 Score 0 0 0  PHQ- 9 Score 0 0 0   Last fall risk screening    04/09/2022    9:13 AM  Fall Risk   Falls in the past year? 0  Number falls in past yr: 0  Injury with Fall? 0  Risk for fall due to : No Fall Risks  Follow up Falls evaluation completed   Last Audit-C alcohol use screening    04/09/2022    9:14 AM  Alcohol Use Disorder Test (AUDIT)  1. How often do you have a drink containing alcohol? 1  2. How many drinks containing alcohol do you have on a typical day when you are drinking? 0  3. How often do you have six or more drinks on one occasion? 0  AUDIT-C Score 1   A score of 3 or more in women, and 4 or more in men indicates increased risk for alcohol abuse, EXCEPT if all of  the points are from question 1   No results found for any visits on 04/09/22.  Assessment & Plan    Routine Health Maintenance and Physical Exam  Exercise Activities and Dietary recommendations  Goals   None     Immunization History  Administered Date(s) Administered   Influenza,inj,Quad PF,6+ Mos 10/05/2014   Td 11/23/2000   Tdap 10/05/2014    Health Maintenance  Topic Date Due   COVID-19 Vaccine (1) Never done   Zoster Vaccines- Shingrix (1 of 2) Never done   MAMMOGRAM  10/23/2022 (Originally 09/10/2020)   INFLUENZA VACCINE  06/23/2022   PAP SMEAR-Modifier  11/02/2022   TETANUS/TDAP  10/05/2024   COLONOSCOPY (Pts [id number] Insurance coverage will need to be confirmed)  02/04/2025   Hepatitis C Screening  Completed   HIV Screening  Completed   HPV VACCINES  Aged Out    Discussed health benefits of physical activity, and encouraged her to engage in regular exercise appropriate for her age and condition.  Problem List Items Addressed This Visit       Endocrine   Hypothyroidism    Chronic, previous unstable Last labs completed in 10/2010 Started on 88 mcg on medication Repeat TSH and free T4 Denies symptoms        Relevant Orders   T4, free   TSH     Other   Annual physical exam - Primary    UTD on dental UTD on vision Due for mammo Not due for PAP Things to do to keep yourself healthy  - Exercise at least 30-45 minutes a day, 3-4 days a week.  - Eat a low-fat diet with lots of fruits and vegetables, up to 7-9 servings per day.  - Seatbelts can save your life. Wear them always.  - Smoke detectors on every level of your home, check batteries every year.  - Eye Doctor - have an eye exam every 1-2 years  - Safe sex - if you may be exposed to STDs, use a condom.  - Alcohol -  If you drink, do it moderately, less than 2 drinks per day.  - Health Care Power of Attorney. Choose someone to speak for you if you are not able.  - Depression is common in our  stressful world.If you're feeling down or losing interest in things you normally enjoy, please come in for a visit.  - Violence - If anyone is threatening or hurting you, please call immediately.         Relevant Orders   CBC with Differential/Platelet   Comprehensive metabolic panel   Lipid panel   T4, free   TSH   Bipolar disorder, in full remission, most recent episode mixed ([facility])    Chronic, stable Previous on lithium; concerns for "  kidney" involvement  Repeat CMP       Moderate mixed hyperlipidemia not requiring statin therapy    Chronic, elevated I recommend diet low in saturated fat and regular exercise - 30 min at least 5 times per week Stable ASCVD risk at 2% The 10-year ASCVD risk score (Arnett DK, et al., 2019) is: 2%   Values used to calculate the score:     Age: 79 years     Sex: Female     Is Non-Hispanic African American: No     Diabetic: No     Tobacco smoker: No     Systolic Blood Pressure: 114 mmHg     Is BP treated: No     HDL Cholesterol: 64 mg/dL     Total Cholesterol: 212 mg/dL        Relevant Orders   MM 3D SCREEN BREAST BILATERAL     Return in about 1 year (around 04/10/2023) for annual examination.     Leilani Merl, FNP, have reviewed all documentation for this visit. The documentation on 04/09/22 for the exam, diagnosis, procedures, and orders are all accurate and complete.    Jacky Kindle, FNP  [facility] [phone] (phone) [phone] (fax)  [facility]

## 2022-04-09 ENCOUNTER — Encounter: Payer: Self-pay | Admitting: Family Medicine

## 2022-04-09 ENCOUNTER — Ambulatory Visit (INDEPENDENT_AMBULATORY_CARE_PROVIDER_SITE_OTHER): Payer: BC Managed Care – PPO | Admitting: Family Medicine

## 2022-04-09 VITALS — BP 114/84 | HR 88 | Temp 97.5°F | Resp 14 | Ht 67.5 in | Wt 153.0 lb

## 2022-04-09 DIAGNOSIS — E782 Mixed hyperlipidemia: Secondary | ICD-10-CM

## 2022-04-09 DIAGNOSIS — Z0000 Encounter for general adult medical examination without abnormal findings: Secondary | ICD-10-CM | POA: Diagnosis not present

## 2022-04-09 DIAGNOSIS — F3178 Bipolar disorder, in full remission, most recent episode mixed: Secondary | ICD-10-CM | POA: Insufficient documentation

## 2022-04-09 DIAGNOSIS — E039 Hypothyroidism, unspecified: Secondary | ICD-10-CM | POA: Diagnosis not present

## 2022-04-09 NOTE — Assessment & Plan Note (Signed)
Chronic, elevated I recommend diet low in saturated fat and regular exercise - 30 min at least 5 times per week Stable ASCVD risk at 2% The 10-year ASCVD risk score (Arnett DK, et al., 2019) is: 2%   Values used to calculate the score:     Age: 61 years     Sex: Female     Is Non-Hispanic African American: No     Diabetic: No     Tobacco smoker: No     Systolic Blood Pressure: 99991111 mmHg     Is BP treated: No     HDL Cholesterol: 64 mg/dL     Total Cholesterol: 212 mg/dL

## 2022-04-09 NOTE — Assessment & Plan Note (Signed)
Chronic, stable Previous on lithium; concerns for "kidney" involvement  Repeat CMP

## 2022-04-09 NOTE — Assessment & Plan Note (Signed)
UTD on dental UTD on vision Due for mammo Not due for PAP Things to do to keep yourself healthy  - Exercise at least 30-45 minutes a day, 3-4 days a week.  - Eat a low-fat diet with lots of fruits and vegetables, up to 7-9 servings per day.  - Seatbelts can save your life. Wear them always.  - Smoke detectors on every level of your home, check batteries every year.  - Eye Doctor - have an eye exam every 1-2 years  - Safe sex - if you may be exposed to STDs, use a condom.  - Alcohol -  If you drink, do it moderately, less than 2 drinks per day.  - Health Care Power of Attorney. Choose someone to speak for you if you are not able.  - Depression is common in our stressful world.If you're feeling down or losing interest in things you normally enjoy, please come in for a visit.  - Violence - If anyone is threatening or hurting you, please call immediately.

## 2022-04-09 NOTE — Assessment & Plan Note (Signed)
Chronic, previous unstable Last labs completed in 10/2010 Started on 88 mcg on medication Repeat TSH and free T4 Denies symptoms

## 2022-04-10 ENCOUNTER — Encounter: Payer: Self-pay | Admitting: Family Medicine

## 2022-04-10 ENCOUNTER — Other Ambulatory Visit: Payer: Self-pay | Admitting: Family Medicine

## 2022-04-10 DIAGNOSIS — N183 Chronic kidney disease, stage 3 unspecified: Secondary | ICD-10-CM

## 2022-04-10 LAB — CBC WITH DIFFERENTIAL/PLATELET
Basophils Absolute: 0.1 10*3/uL (ref 0.0–0.2)
Basos: 1 %
EOS (ABSOLUTE): 0.4 10*3/uL (ref 0.0–0.4)
Eos: 9 %
Hematocrit: 42.9 % (ref 34.0–46.6)
Hemoglobin: 14.2 g/dL (ref 11.1–15.9)
Immature Grans (Abs): 0 10*3/uL (ref 0.0–0.1)
Immature Granulocytes: 0 %
Lymphocytes Absolute: 1.8 10*3/uL (ref 0.7–3.1)
Lymphs: 37 %
MCH: 29.8 pg (ref 26.6–33.0)
MCHC: 33.1 g/dL (ref 31.5–35.7)
MCV: 90 fL (ref 79–97)
Monocytes Absolute: 0.4 10*3/uL (ref 0.1–0.9)
Monocytes: 7 %
Neutrophils Absolute: 2.2 10*3/uL (ref 1.4–7.0)
Neutrophils: 46 %
Platelets: 260 10*3/uL (ref 150–450)
RBC: 4.77 x10E6/uL (ref 3.77–5.28)
RDW: 11.9 % (ref 11.7–15.4)
WBC: 4.8 10*3/uL (ref 3.4–10.8)

## 2022-04-10 LAB — COMPREHENSIVE METABOLIC PANEL
ALT: 12 IU/L (ref 0–32)
AST: 20 IU/L (ref 0–40)
Albumin/Globulin Ratio: 2 (ref 1.2–2.2)
Albumin: 4.7 g/dL (ref 3.8–4.9)
Alkaline Phosphatase: 59 IU/L (ref 44–121)
BUN/Creatinine Ratio: 15 (ref 9–23)
BUN: 17 mg/dL (ref 6–24)
Bilirubin Total: 0.5 mg/dL (ref 0.0–1.2)
CO2: 21 mmol/L (ref 20–29)
Calcium: 9.5 mg/dL (ref 8.7–10.2)
Chloride: 104 mmol/L (ref 96–106)
Creatinine, Ser: 1.1 mg/dL — ABNORMAL HIGH (ref 0.57–1.00)
Globulin, Total: 2.4 g/dL (ref 1.5–4.5)
Glucose: 111 mg/dL — ABNORMAL HIGH (ref 70–99)
Potassium: 4.3 mmol/L (ref 3.5–5.2)
Sodium: 140 mmol/L (ref 134–144)
Total Protein: 7.1 g/dL (ref 6.0–8.5)
eGFR: 58 mL/min/{1.73_m2} — ABNORMAL LOW (ref >59)

## 2022-04-10 LAB — LIPID PANEL
Chol/HDL Ratio: 3.9 ratio (ref 0.0–4.4)
Cholesterol, Total: 231 mg/dL — ABNORMAL HIGH (ref 100–199)
HDL: 60 mg/dL (ref >39)
LDL Chol Calc (NIH): 158 mg/dL — ABNORMAL HIGH (ref 0–99)
Triglycerides: 73 mg/dL (ref 0–149)
VLDL Cholesterol Cal: 13 mg/dL (ref 5–40)

## 2022-04-10 LAB — TSH: TSH: 0.5 u[IU]/mL (ref 0.450–4.500)

## 2022-04-10 LAB — T4, FREE: Free T4: 1.46 ng/dL (ref 0.82–1.77)

## 2022-06-09 ENCOUNTER — Other Ambulatory Visit: Payer: Self-pay | Admitting: Nephrology

## 2022-06-09 DIAGNOSIS — N189 Chronic kidney disease, unspecified: Secondary | ICD-10-CM | POA: Diagnosis not present

## 2022-06-09 DIAGNOSIS — E785 Hyperlipidemia, unspecified: Secondary | ICD-10-CM | POA: Diagnosis not present

## 2022-06-09 DIAGNOSIS — N1831 Chronic kidney disease, stage 3a: Secondary | ICD-10-CM

## 2022-06-09 DIAGNOSIS — F32A Depression, unspecified: Secondary | ICD-10-CM | POA: Diagnosis not present

## 2022-06-25 ENCOUNTER — Other Ambulatory Visit: Payer: BC Managed Care – PPO

## 2022-07-02 ENCOUNTER — Ambulatory Visit
Admission: RE | Admit: 2022-07-02 | Discharge: 2022-07-02 | Disposition: A | Discharge status: Home / Self Care | Payer: BC Managed Care – PPO | Source: Ambulatory Visit | Attending: Nephrology | Admitting: Nephrology

## 2022-07-02 DIAGNOSIS — E785 Hyperlipidemia, unspecified: Secondary | ICD-10-CM | POA: Diagnosis not present

## 2022-07-02 DIAGNOSIS — R188 Other ascites: Secondary | ICD-10-CM | POA: Diagnosis not present

## 2022-07-02 DIAGNOSIS — N1831 Chronic kidney disease, stage 3a: Secondary | ICD-10-CM | POA: Insufficient documentation

## 2022-07-02 DIAGNOSIS — N189 Chronic kidney disease, unspecified: Secondary | ICD-10-CM | POA: Diagnosis not present

## 2022-10-17 ENCOUNTER — Other Ambulatory Visit: Payer: Self-pay | Admitting: Family Medicine

## 2022-10-17 DIAGNOSIS — E039 Hypothyroidism, unspecified: Secondary | ICD-10-CM

## 2022-12-07 ENCOUNTER — Ambulatory Visit: Payer: Commercial Managed Care - PPO | Attending: Sports Medicine | Admitting: Occupational Therapy

## 2022-12-07 DIAGNOSIS — M6281 Muscle weakness (generalized): Secondary | ICD-10-CM | POA: Insufficient documentation

## 2022-12-07 DIAGNOSIS — M79642 Pain in left hand: Secondary | ICD-10-CM | POA: Insufficient documentation

## 2022-12-07 DIAGNOSIS — R600 Localized edema: Secondary | ICD-10-CM | POA: Insufficient documentation

## 2022-12-07 DIAGNOSIS — M25642 Stiffness of left hand, not elsewhere classified: Secondary | ICD-10-CM | POA: Insufficient documentation

## 2022-12-07 DIAGNOSIS — G8929 Other chronic pain: Secondary | ICD-10-CM | POA: Insufficient documentation

## 2022-12-07 DIAGNOSIS — M25512 Pain in left shoulder: Secondary | ICD-10-CM | POA: Insufficient documentation

## 2022-12-07 NOTE — Therapy (Signed)
[facility] [address], [place], [address] Phone: [phone]   Fax:  [phone]  Occupational Therapy Screen:  Patient Details  Name: Sherry Hammond MRN: [id number] Date of Birth: 25-Aug-1961 No data recorded  Encounter Date: 12/07/2022   OT End of Session - 12/07/22 1023     Visit Number 0             Past Medical History:  Diagnosis Date   Allergy    Depression    Hyperlipidemia    Thyroid disease     Past Surgical History:  Procedure Laterality Date   CESAREAN SECTION      There were no vitals filed for this visit.   Subjective Assessment - 12/07/22 1021     Subjective  I walked dog about 2 months ago and it pulled on my L arm and fx my ring finger and my hurt my shoulder. I seen Dr in [place] for shot in my shoulder last week . And then still some swelling and pain in my ring finger with gripping, and using it. Cannot get my rings off    Currently in Pain? Yes    Pain Score 2     Pain Location Finger (Comment which one)    Pain Orientation Left    Pain Descriptors / Indicators Sore              Patient arrived for prescription for left ring finger injury as well as shoulder adhesive capsulitis. Patient with new insurance card and not sure if her insurance is covering therapy. Office staff could not confirm patient's coverage. Patient seen for screening today for her left ring finger. Appear patient was holding a leash walking her dog when the ring finger and arm was pulled. Patient had a small fracture at the middle phalanges on the left ring finger about 8 weeks ago. Patient is active range of motion is within normal limits but pain increases to a 2/10 at the PIP as well as middle phalanges dorsally. Patient was still a little bit of edema at PIP unable to take off her rings. Advised patient to do to 3 times a day contrast followed by tendon glides active range of motion  10 reps each pain-free. Nighttime leg Coban compression educated on to decrease edema. Patient rescheduled evaluation date for next week. Patient to call me if she still needs to be evaluated at that time.  Otherwise we will schedule a PT eval for the left shoulder. Patient also to ask for records from her physician to be sent.                                        Visit Diagnosis: Pain in left hand    Problem List Patient Active Problem List   Diagnosis Date Noted   Bipolar disorder, in full remission, most recent episode mixed ([place]) 04/09/2022   Moderate mixed hyperlipidemia not requiring statin therapy 04/09/2022   Annual physical exam 04/09/2022   Hypothyroidism 11/03/2021    Rosalyn Gess, OTR/L,CLT 12/07/2022, 10:23 AM  [facility] [address], [place], [address] Phone: [phone]   Fax:  [phone]  Name: Sherry Hammond MRN: [id number] Date of Birth: 06/20/1961

## 2022-12-15 ENCOUNTER — Ambulatory Visit: Payer: Commercial Managed Care - PPO | Admitting: Occupational Therapy

## 2022-12-15 ENCOUNTER — Encounter: Payer: Self-pay | Admitting: Occupational Therapy

## 2022-12-15 DIAGNOSIS — R600 Localized edema: Secondary | ICD-10-CM | POA: Diagnosis present

## 2022-12-15 DIAGNOSIS — G8929 Other chronic pain: Secondary | ICD-10-CM | POA: Diagnosis present

## 2022-12-15 DIAGNOSIS — M6281 Muscle weakness (generalized): Secondary | ICD-10-CM | POA: Diagnosis present

## 2022-12-15 DIAGNOSIS — M25642 Stiffness of left hand, not elsewhere classified: Secondary | ICD-10-CM

## 2022-12-15 DIAGNOSIS — M25512 Pain in left shoulder: Secondary | ICD-10-CM | POA: Diagnosis present

## 2022-12-15 DIAGNOSIS — M79642 Pain in left hand: Secondary | ICD-10-CM | POA: Diagnosis present

## 2022-12-15 NOTE — Therapy (Signed)
[facility] [address] [place], [address] Phone: [phone]   Fax:  [phone]  Occupational Therapy Evaluation  Patient Details  Name: Sherry Hammond MRN: [id number] Date of Birth: 1961/10/23 Referring Provider (OT): Layne Benton, MD   Encounter Date: 12/15/2022   OT End of Session - 12/15/22 1422     Visit Number 1    Number of Visits 6    Date for OT Re-Evaluation 01/26/23    OT Start Time 0947    OT Stop Time 1028    OT Time Calculation (min) 41 min    Activity Tolerance Patient tolerated treatment well    Behavior During Therapy [facility] for tasks assessed/performed             Past Medical History:  Diagnosis Date   Allergy    Depression    Hyperlipidemia    Thyroid disease     Past Surgical History:  Procedure Laterality Date   CESAREAN SECTION      There were no vitals filed for this visit.   Subjective Assessment - 12/15/22 1413     Subjective  I walked dog about 2-3 months ago and it pulled on my L arm and fx my ring finger and  hurt my shoulder. I seen Dr in [place] for shot in my shoulder 2 wksago . And then still some swelling and pain in my ring finger with gripping, and using it. Cannot get my rings off    Pertinent History Pt hand got hang up in dog leash about early Nov - and got pulled. resulting in painfull shoulder and 4th digit. Pt cont to have increase pain at 4th digit PIP and middle phalange -show old fx , ulnar collateral ligament intact - some soft tissue injury on ulnar side and volar plate.    Patient Stated Goals Want the pain and swelling in my ring finger better so I can use it with cooking, with my grandbaby, gripping and squeezing    Currently in Pain? Yes    Pain Score 2     Pain Location Finger (Comment which one)    Pain Orientation Left    Pain Descriptors / Indicators Sore    Pain Type Acute pain    Pain Onset More than a month ago    Pain Frequency  Intermittent    Aggravating Factors  gripping , making fist ,squeeze               [facility] OT Assessment - 12/15/22 0001       Assessment   Medical Diagnosis L 4th digit sprain, old fx    Referring Provider (OT) Layne Benton, MD    Onset Date/Surgical Date --   early Nov   Hand Dominance Right      Balance Screen   Has the patient fallen in the past 6 months Yes    How many times? 1    Has the patient had a decrease in activity level because of a fear of falling?  No    Is the patient reluctant to leave their home because of a fear of falling?  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 -      Edema   Edema 5.5 L PIP, R PIP 5.9 cm - 4th digit L  Strength   Right Hand Grip (lbs) 80    Right Hand Lateral Pinch 14 lbs    Right Hand 3 Point Pinch 19 lbs    Left Hand Grip (lbs) 73    Left Hand Lateral Pinch 5 lbs    Left Hand 3 Point Pinch 16 lbs      Left Hand AROM   L Ring  MCP 0-90 90 Degrees    L Ring PIP 0-100 85 Degrees    L Ring DIP 0-70 70 Degrees                      OT Treatments/Exercises (OP) - 12/15/22 0001       LUE Fluidotherapy   Number Minutes Fluidotherapy 8 Minutes    LUE Fluidotherapy Location Hand    Comments tendon glides  pain free prior to review of HEP             Patient had pain-free range of motion after fluidotherapy. If patient has increased edema  with some pain - can continue with contrast at home 3 times a day.  Patient fitted with a silicone sleeves to wear at nighttime for edema PIP as well as shorter ones during the day. Tendon glides pain-free.10 reps She was educated on joint protection and modifications to avoid tight prolonged grasp.  Using larger joints and  enlarge grips.        OT Education - 12/15/22 1421     Education Details Findings of evaluation and home program    Person(s) Educated  Patient    Methods Explanation;Demonstration;Tactile cues;Handout;Verbal cues    Comprehension Verbalized understanding;Returned demonstration              OT Short Term Goals - 12/15/22 1427       OT SHORT TERM GOAL #1   Title Patient to be independent in home program to decrease edema and tenderness and pain to increase PIP flexion    Baseline PIP flexion 85 degrees.  Edema PIP 0.5 cm with tenderness at ulnar PIP and dorsal middle phalanges.  Pain increased 3/10 at PIP during composite fist    Time 3    Period Weeks    Status New    Target Date 01/05/23               OT Long Term Goals - 12/15/22 1428       OT LONG TERM GOAL #1   Title Left fourth digit PIP flexion crease to within normal limits to be able to make a composite fist during ADL's pain free.    Baseline PIP flexion 85 degrees with pain 3/10.  Edema at PIP with tenderness over ulnar PIP and dorsal middle phalanges    Time 6    Period Weeks    Status New    Target Date 01/26/23      OT LONG TERM GOAL #2   Title Patient to be symptom-free with a L composite fist cooking, driving and playing with grandbabies    Baseline Dull pain 3/10 in fourth digit    Time 6    Period Weeks    Status New    Target Date 01/26/23                   Plan - 12/15/22 1423     Clinical Impression Statement Patient presented at OT evaluation close about 3 months out from sprain of left fourth digit.  Ulnar collateral ligament at left  fourth PIP intact -do show some swelling over volar plate, and old fx into middle phalanges.  Patient mostly limited with pain and stiffness in composite tight fist.  Tenderness over the ulnar PIP with swelling 0.5 cm.  PIP flexion at 85.  Patient was educated on modifications to to task while decreasing edema, pain and stiffness at fourth digit.  Patient limited in functional use of left hand mostly in composite fist but less lateral pinch.  Patient can benefit from OT services to  decrease edema and pain and increased motion and strength to return to prior level of function.    OT Occupational Profile and History Problem Focused Assessment - Including review of records relating to presenting problem    Occupational performance deficits (Please refer to evaluation for details): ADL's;IADL's;Leisure;Play;Social Participation    Body Structure / Function / Physical Skills ADL;Edema;ROM;Strength;Flexibility;IADL;Pain;UE functional use    Rehab Potential Good    Clinical Decision Making Limited treatment options, no task modification necessary    Comorbidities Affecting Occupational Performance: None    Modification or Assistance to Complete Evaluation  No modification of tasks or assist necessary to complete eval    OT Frequency 1x / week    OT Duration 6 weeks    OT Treatment/Interventions Self-care/ADL training;Ultrasound;Contrast Bath;Fluidtherapy;Therapeutic exercise;Patient/family education;DME and/or AE instruction;Manual Therapy    Consulted and Agree with Plan of Care Patient             Patient will benefit from skilled therapeutic intervention in order to improve the following deficits and impairments:   Body Structure / Function / Physical Skills: ADL, Edema, ROM, Strength, Flexibility, IADL, Pain, UE functional use       Visit Diagnosis: Pain in left hand  Stiffness of left hand, not elsewhere classified  Muscle weakness (generalized)  Localized edema    Problem List Patient Active Problem List   Diagnosis Date Noted   Bipolar disorder, in full remission, most recent episode mixed (HCC) 04/09/2022   Moderate mixed hyperlipidemia not requiring statin therapy 04/09/2022   Annual physical exam 04/09/2022   Hypothyroidism 11/03/2021    Oletta Cohn, OTR/L,CLT 12/15/2022, 2:32 PM  [facility] [address] [place], [address] Phone: [phone]   Fax:  [phone]  Name:  Sherry Hammond MRN: [id number] Date of Birth: [birth year]

## 2022-12-17 ENCOUNTER — Encounter: Payer: Self-pay | Admitting: Physical Therapy

## 2022-12-17 ENCOUNTER — Ambulatory Visit: Payer: Commercial Managed Care - PPO | Admitting: Physical Therapy

## 2022-12-17 DIAGNOSIS — G8929 Other chronic pain: Secondary | ICD-10-CM

## 2022-12-17 DIAGNOSIS — M79642 Pain in left hand: Secondary | ICD-10-CM | POA: Diagnosis not present

## 2022-12-17 NOTE — Therapy (Unsigned)
OUTPATIENT PHYSICAL THERAPY SHOULDER EVALUATION   Patient Name: Sherry Hammond MRN: [id number] DOB:01-Jan-1961, 62 y.o., female Today's Date: 12/21/2022  END OF SESSION:  PT End of Session - 12/21/22 1355     Visit Number 1    Number of Visits 17    Date for PT Re-Evaluation 02/26/23    Authorization - Visit Number 1    Authorization - Number of Visits 10    Progress Note Due on Visit 10    PT Start Time 1315    PT Stop Time 1400    PT Time Calculation (min) 45 min    Activity Tolerance Patient tolerated treatment well    Behavior During Therapy [facility] for tasks assessed/performed              Past Medical History:  Diagnosis Date   Allergy    Depression    Hyperlipidemia    Thyroid disease    Past Surgical History:  Procedure Laterality Date   CESAREAN SECTION     Patient Active Problem List   Diagnosis Date Noted   Bipolar disorder, in full remission, most recent episode mixed ([place]) 04/09/2022   Moderate mixed hyperlipidemia not requiring statin therapy 04/09/2022   Annual physical exam 04/09/2022   Hypothyroidism 11/03/2021    PCP: Tally Joe T.   REFERRING PROVIDER: Verner Chol.  REFERRING DIAG: RTC tendinopathy  THERAPY DIAG:  Chronic left shoulder pain  Rationale for Evaluation and Treatment: Rehabilitation  ONSET DATE: May 2023  SUBJECTIVE:                                                                                                                                                                                      SUBJECTIVE STATEMENT: L shoulder pain  PERTINENT HISTORY: Pt reports her L shoulder pain started in May 2023. No specific MOI. She started playing the cello in November 2022 and as months passed she noticed her ROM decrease. She stopped playing the cello in May 2023 due to onset of remittent L shoulder pain. L shoulder pain has remained ISQ since onset. Pt went to the MD who told her she has "frozen shoulder". She had an  MRI done and it was unremarkable. Pt mentions MD had her get a Cortizone shot 3 weeks ago but she feels like it didn't really help. ON/OFF: quick; low irritability. Pt reports her L shoulder pain "ebbs and flows" as the day goes on but she "doesn't have to baby it constantly". She is able to carry her granddaughter although it is slightly painful. Pt reports putting on a jacket and anything with extension tends to aggravate her pain. Pt noted no  previous episodes. 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 24-hr period: wakes up at a 5/10 on the NPS and will go down to a 2-3/10 when periodically throughout the day. She experiences pain when her L shoulder is compressed. Pt reports that her pain is located "over the top of her shoulder". Pt is R-hand dominant. No N/T. PMH: bipolar disorder. Pt reports she lives with her husband and has a 57 y.o. son. Pt denies N/V, B&B changes, unexplained weight loss, saddle paresthesia, fever, night sweats, or unrelenting night pain at this time.  PAIN:  Are you having pain? Yes: NPRS scale: C - 2/10; W - 7/10; B - 1//10 Pain location: Over the top of the L shoulder Pain description: Achy; occasionally sharp  Aggravating factors: Putting on jacket, anything that requires extension Relieving factors: Ice at night  PRECAUTIONS: None  WEIGHT BEARING RESTRICTIONS: No  FALLS:  Has patient fallen in last 6 months? 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 and a 76 year old son at home  PLOF: Independent  PATIENT GOALS: Get back to ADLs pain free  NEXT MD VISIT:   OBJECTIVE:   DIAGNOSTIC FINDINGS:  MRI was unremarkable  PATIENT SURVEYS:  FOTO 65  COGNITION: Overall cognitive status: Within functional limits for tasks assessed     SENSATION: WFL  POSTURE/Observation: PT observed neutral posture at rest- no abnormalities Overhead flexion with scapular  dyskinesis- shoulder hike/elevation with excessive upward rotation  UPPER EXTREMITY ROM:   Active ROM Right eval Left eval  Shoulder flexion WNL 148  Shoulder extension WNL 45  Shoulder abduction WNL 136  Shoulder adduction    Shoulder internal rotation WNL 30/45   30/45  36  Shoulder external rotation WNL 60  Elbow flexion    Elbow extension    Wrist flexion    Wrist extension    Wrist ulnar deviation    Wrist radial deviation    Wrist pronation    Wrist supination    (Blank rows = not tested)  Passive ROM Right eval Left eval  Shoulder flexion WNL 160  Shoulder extension WNL 56  Shoulder abduction WNL 150  Shoulder adduction    Shoulder internal rotation WNL 30/45   30/45  50  Shoulder external rotation WNL 70  Elbow flexion    Elbow extension    Wrist flexion    Wrist extension    Wrist ulnar deviation    Wrist radial deviation    Wrist pronation    Wrist supination    (Blank rows = not tested)  UPPER EXTREMITY MMT:  MMT Right eval Left eval  Shoulder flexion 5 5  Shoulder extension 5 4  Shoulder abduction 5 3+  Shoulder adduction    Shoulder internal rotation 5 5  Shoulder external rotation 5 5  Middle trapezius 5 4  Lower trapezius 5 4  Elbow flexion    Elbow extension    Wrist flexion    Wrist extension    Wrist ulnar deviation    Wrist radial deviation    Wrist pronation    Wrist supination    Grip strength (lbs)    (Blank rows = not tested)  *MMT grades presented in available ROM  SHOULDER SPECIAL TESTS: Impingement tests: Neer impingement test: negative and Resisted ER test: negative SLAP lesions:  Not indicated Instability tests:  Not indicated Rotator cuff assessment:  Lateral jobe test:  positive  ER Lag: Negative Anterior instability: Anterior apprehension test: negative  JOINT MOBILITY TESTING:  Anterior glide: unremarkable Inferior glide: unremarkable Posterior glide: uncomfortable and a little painful; otherwise  unremarkable  None of the glides felt better with repeated motion.    PALPATION:  Mild tenderness at insertion of the bicep   TODAY'S TREATMENT:                                                                                                                                         DATE: 12/17/22  Ther-ex: PT reviewed the following HEP with patient with patient able to demonstrate a set of the following with min cuing for correction needed. PT educated patient on parameters of therex (how/when to inc/decrease intensity, frequency, rep/set range, stretch hold time, and purpose of therex) with verbalized understanding.   Standing flexion wall slides x 12  Standing abduction wall slides x 12   Low pec stretch 1 x 30 sec hold (1x/day)  PATIENT EDUCATION: Education details: Pt was educated on diagnosis, prognosis, and POC. PT instructed and educated pt on HEP and she was able to demonstrate and verbally recall with the use of demonstrations and VC. Pt executed therex with proper form and technique with good carry over. Person educated: Patient Education method: Consulting civil engineer, Demonstration, Verbal cues, and Handouts Education comprehension: returned demonstration  HOME EXERCISE PROGRAM: Standing flexion wall slides x 12 (1x/day) Standing abduction wall slides x 12 (1x/day)   Low pec stretch 1 x 30 sec hold (1x/day)  ASSESSMENT:  CLINICAL IMPRESSION: Patient is a 62 y.o. woman who was seen today for physical therapy evaluation and treatment for localized L shoulder pain. Pt with concern of adhesive capsulitis, current deficits and examination revealing signs and symptoms of RTC dysfunction (grossly normal PROM, unremarkable PAM, weakness, localized tenderness); though the best determinant of adhesive capsulitis diagnosis will be time dependent as patient does present with capsular pattern of deficits.  Pt presenting with impairments in L shoulder AROM/PROM,  L shoulder strength and periscapular  strength, scapulohumeral rhythm, and pain. Activity limitations in dressing, overhead lifting/reaching,carrying, pushing and pulling; inhibiting full participation of ADLs. PT would benefit on skilled PT to decrease pain and increase gross shoulder impairments mentioned above to return to PLOF without pain and improve QoL.      OBJECTIVE IMPAIRMENTS: decreased activity tolerance, decreased coordination, decreased endurance, decreased ROM, decreased strength, increased fascial restrictions, impaired flexibility, impaired UE functional use, improper body mechanics, postural dysfunction, and pain.   ACTIVITY LIMITATIONS: carrying, lifting, sleeping, bathing, dressing, reach over head, hygiene/grooming, and strumming  PARTICIPATION LIMITATIONS:  cleaning and laundry  playing cello  PERSONAL FACTORS:  N/A  REHAB POTENTIAL: Excellent  CLINICAL DECISION MAKING: Stable/uncomplicated  EVALUATION COMPLEXITY: Low   GOALS: Goals reviewed with patient? Yes  SHORT TERM GOALS: Target date: 01/14/23  Pt will execute HEP independently with proper form and technique in order  to return to PLOF at home. Baseline: HEP given on 12/17/22 Goal status: INITIAL   LONG TERM GOALS: Target date: 02/11/23  Pt will increase FOTO score to 73 to demonstrate predicted increase in functional mobility to complete ADLs Baseline: 65 Goal status: INITIAL  2.  Pt will decrease worst pain as reported on NPS by at least 3 points in order to demonstrate clinically significant reduction in pain. Baseline: NPS - 7/10 Goal status: INITIAL  3.  Pt will increase shoulder ABD MMT to 4/5 in order to demonstrate strength needed for ADLs Baseline: 3+/5 Goal status: INITIAL  4.  Pt will increase shoulder AROM WNL to demonstrate ROM needed for ADLs Baseline: 12/21/22 abd: 136d Goal status: INITIAL   PLAN:  PT FREQUENCY: 2x/week  PT DURATION: 8 weeks .  PLANNED INTERVENTIONS: Therapeutic exercises, Therapeutic activity,  Neuromuscular re-education, Patient/Family education, Self Care, and Joint mobilization  PLAN FOR NEXT SESSION: Perform the following tests: belly press, ER lag sign, posterior capsule instability test, and Beighton score     Hilda Lias DPT Hilda Lias, PT 12/21/2022, 1:58 PM

## 2022-12-21 NOTE — Addendum Note (Signed)
Addended by: Kelton Pillar on: 12/21/2022 01:59 PM   Modules accepted: Orders

## 2022-12-22 ENCOUNTER — Ambulatory Visit: Payer: Commercial Managed Care - PPO | Admitting: Physical Therapy

## 2022-12-22 ENCOUNTER — Encounter: Payer: Self-pay | Admitting: Physical Therapy

## 2022-12-22 ENCOUNTER — Ambulatory Visit: Payer: Commercial Managed Care - PPO | Admitting: Occupational Therapy

## 2022-12-22 DIAGNOSIS — G8929 Other chronic pain: Secondary | ICD-10-CM

## 2022-12-22 DIAGNOSIS — M79642 Pain in left hand: Secondary | ICD-10-CM | POA: Diagnosis not present

## 2022-12-22 NOTE — Therapy (Signed)
OUTPATIENT PHYSICAL THERAPY SHOULDER EVALUATION   Patient Name: Sherry Hammond MRN: [id number] DOB:Feb 19, 1961, 62 y.o., female Today's Date: 12/22/2022  END OF SESSION:  PT End of Session - 12/22/22 1307     Visit Number 2    Number of Visits 17    Date for PT Re-Evaluation 02/26/23    Authorization - Visit Number 2    Authorization - Number of Visits 10    Progress Note Due on Visit 10    PT Start Time 3295    PT Stop Time 1343    PT Time Calculation (min) 39 min    Activity Tolerance Patient tolerated treatment well    Behavior During Therapy [facility] for tasks assessed/performed               Past Medical History:  Diagnosis Date   Allergy    Depression    Hyperlipidemia    Thyroid disease    Past Surgical History:  Procedure Laterality Date   CESAREAN SECTION     Patient Active Problem List   Diagnosis Date Noted   Bipolar disorder, in full remission, most recent episode mixed ([place]) 04/09/2022   Moderate mixed hyperlipidemia not requiring statin therapy 04/09/2022   Annual physical exam 04/09/2022   Hypothyroidism 11/03/2021    PCP: Tally Joe T.   REFERRING PROVIDER: Verner Chol.  REFERRING DIAG: RTC tendinopathy  THERAPY DIAG:  Chronic left shoulder pain  Rationale for Evaluation and Treatment: Rehabilitation  ONSET DATE: May 2023  SUBJECTIVE:                                                                                                                                                                                      SUBJECTIVE STATEMENT: Pt reports she felt good following IE. Only having 2/10 shoulder pain to date. She reports she was able to move her shoulder more and may have "overdone it" a little and is sore today.   PERTINENT HISTORY: Pt reports her L shoulder pain started in May 2023. No specific MOI. She started playing the cello in November 2022 and as months passed she noticed her ROM decrease. She stopped playing the  cello in May 2023 due to onset of remittent L shoulder pain. L shoulder pain has remained ISQ since onset. Pt went to the MD who told her she has "frozen shoulder". She had an MRI done and it was unremarkable. Pt mentions MD had her get a Cortizone shot 3 weeks ago but she feels like it didn't really help. ON/OFF: quick; low irritability. Pt reports her L shoulder pain "ebbs and flows" as the day goes on but she "doesn't have  to baby it constantly". She is able to carry her granddaughter although it is slightly painful. Pt reports putting on a jacket and anything with extension tends to aggravate her pain. Pt noted no previous episodes. 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 24-hr period: wakes up at a 5/10 on the NPS and will go down to a 2-3/10 when periodically throughout the day. She experiences pain when her L shoulder is compressed. Pt reports that her pain is located "over the top of her shoulder". Pt is R-hand dominant. No N/T. PMH: bipolar disorder. Pt reports she lives with her husband and has a 60 y.o. son. Pt denies N/V, B&B changes, unexplained weight loss, saddle paresthesia, fever, night sweats, or unrelenting night pain at this time.  PAIN:  Are you having pain? Yes: NPRS scale: C - 2/10; W - 7/10; B - 1//10 Pain location: Over the top of the L shoulder Pain description: Achy; occasionally sharp  Aggravating factors: Putting on jacket, anything that requires extension Relieving factors: Ice at night  PRECAUTIONS: None  WEIGHT BEARING RESTRICTIONS: No  FALLS:  Has patient fallen in last 6 months? 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 and a 60 year old son at home  PLOF: Independent  PATIENT GOALS: Get back to ADLs pain free  NEXT MD VISIT:   OBJECTIVE:   DIAGNOSTIC FINDINGS:  MRI was unremarkable  PATIENT SURVEYS:  FOTO 65  COGNITION: Overall cognitive status: Within  functional limits for tasks assessed     SENSATION: WFL  POSTURE/Observation: PT observed neutral posture at rest- no abnormalities Overhead flexion with scapular dyskinesis- shoulder hike/elevation with excessive upward rotation  UPPER EXTREMITY ROM:   Active ROM Right eval Left eval  Shoulder flexion WNL 148  Shoulder extension WNL 45  Shoulder abduction WNL 136  Shoulder adduction    Shoulder internal rotation WNL 30/45   30/45  36  Shoulder external rotation WNL 60  Elbow flexion    Elbow extension    Wrist flexion    Wrist extension    Wrist ulnar deviation    Wrist radial deviation    Wrist pronation    Wrist supination    (Blank rows = not tested)  Passive ROM Right eval Left eval  Shoulder flexion WNL 160  Shoulder extension WNL 56  Shoulder abduction WNL 150  Shoulder adduction    Shoulder internal rotation WNL 30/45   30/45  50  Shoulder external rotation WNL 70  Elbow flexion    Elbow extension    Wrist flexion    Wrist extension    Wrist ulnar deviation    Wrist radial deviation    Wrist pronation    Wrist supination    (Blank rows = not tested)  UPPER EXTREMITY MMT:  MMT Right eval Left eval  Shoulder flexion 5 5  Shoulder extension 5 4  Shoulder abduction 5 3+  Shoulder adduction    Shoulder internal rotation 5 5  Shoulder external rotation 5 5  Middle trapezius 5 4  Lower trapezius 5 4  Elbow flexion    Elbow extension    Wrist flexion    Wrist extension    Wrist ulnar deviation    Wrist radial deviation    Wrist pronation    Wrist supination    Grip strength (lbs)    (Blank rows = not tested)  *MMT grades presented in  available ROM  SHOULDER SPECIAL TESTS: Impingement tests: Neer impingement test: negative and Resisted ER test: negative SLAP lesions:  Not indicated Instability tests:  Not indicated Rotator cuff assessment:  Lateral jobe test: positive  ER Lag: Negative Anterior instability: Anterior apprehension  test: negative  JOINT MOBILITY TESTING:  Anterior glide: unremarkable Inferior glide: unremarkable Posterior glide: uncomfortable and a little painful; otherwise unremarkable  None of the glides felt better with repeated motion.    PALPATION:  Mild tenderness at insertion of the bicep   TODAY'S TREATMENT:                                                                                                                                         DATE: 12/17/22  Ther-ex: UBE 45min FWD; 66min BWD L2 for gentle strengthening and mobility  Landmine AAROM flexion x12 with min cuing for scapulohumeral rhythm with good carry over; same set up for abd - more painful in abd with patient reporting concordant dicomfort  Supine snow angel x12 max overhead abd 121d on LUE Supine AAROM with dowel overhead flex with scapulohumeral rhythm focus x12 2secH in max overhead flex  Sidelying lat stretch 2x 30sec  Supine AAROM ER dowel flexion x12 2secH  Y on wall 2x 12 with good carry over of cuing for technique   Manual STM with trigger point release to latissimus, ant/lat deltoid complex, bicep brachi PROM abd and ER with post GHJ mob 3x 30sec for each   PATIENT EDUCATION: Education details: Pt was educated on diagnosis, prognosis, and POC. PT instructed and educated pt on HEP and she was able to demonstrate and verbally recall with the use of demonstrations and VC. Pt executed therex with proper form and technique with good carry over. Person educated: Patient Education method: Consulting civil engineer, Demonstration, Verbal cues, and Handouts Education comprehension: returned demonstration  HOME EXERCISE PROGRAM: Standing flexion wall slides x 12 (1x/day) Standing abduction wall slides x 12 (1x/day)   Low pec stretch 1 x 30 sec hold (1x/day)  ASSESSMENT:  CLINICAL IMPRESSION: PT initiated therex progression for increased scapulohumeral rhythm and mobility, and for reduced muscle tension and pain. Patient is  able to comply with all cuing for proper technique for therex with increased pain with abd P/AROM, that session is able to accommodate within. Patient with palpable decrease in muscle tension/trigger points with manual techniques, minimal increase in mobility. PT will continue progression as able.      OBJECTIVE IMPAIRMENTS: decreased activity tolerance, decreased coordination, decreased endurance, decreased ROM, decreased strength, increased fascial restrictions, impaired flexibility, impaired UE functional use, improper body mechanics, postural dysfunction, and pain.   ACTIVITY LIMITATIONS: carrying, lifting, sleeping, bathing, dressing, reach over head, hygiene/grooming, and strumming  PARTICIPATION LIMITATIONS:  cleaning and laundry  playing cello  PERSONAL FACTORS:  N/A  REHAB POTENTIAL: Excellent  CLINICAL DECISION MAKING: Stable/uncomplicated  EVALUATION COMPLEXITY: Low   GOALS: Goals reviewed  with patient? Yes  SHORT TERM GOALS: Target date: 01/14/23  Pt will execute HEP independently with proper form and technique in order to return to PLOF at home. Baseline: HEP given on 12/17/22 Goal status: INITIAL   LONG TERM GOALS: Target date: 02/11/23  Pt will increase FOTO score to 73 to demonstrate predicted increase in functional mobility to complete ADLs Baseline: 65 Goal status: INITIAL  2.  Pt will decrease worst pain as reported on NPS by at least 3 points in order to demonstrate clinically significant reduction in pain. Baseline: NPS - 7/10 Goal status: INITIAL  3.  Pt will increase shoulder ABD MMT to 4/5 in order to demonstrate strength needed for ADLs Baseline: 3+/5 Goal status: INITIAL  4.  Pt will increase shoulder AROM WNL to demonstrate ROM needed for ADLs Baseline: 12/21/22 abd: 136d Goal status: INITIAL   PLAN:  PT FREQUENCY: 2x/week  PT DURATION: 8 weeks .  PLANNED INTERVENTIONS: Therapeutic exercises, Therapeutic activity, Neuromuscular  re-education, Patient/Family education, Self Care, and Joint mobilization  PLAN FOR NEXT SESSION: Perform the following tests: belly press, ER lag sign, posterior capsule instability test, and Beighton score     Durwin Reges DPT Durwin Reges, PT 12/22/2022, 2:36 PM

## 2022-12-24 ENCOUNTER — Ambulatory Visit: Payer: Commercial Managed Care - PPO | Admitting: Occupational Therapy

## 2022-12-24 ENCOUNTER — Ambulatory Visit: Payer: Commercial Managed Care - PPO | Attending: Sports Medicine | Admitting: Physical Therapy

## 2022-12-24 ENCOUNTER — Encounter: Payer: Self-pay | Admitting: Physical Therapy

## 2022-12-24 DIAGNOSIS — M79642 Pain in left hand: Secondary | ICD-10-CM | POA: Insufficient documentation

## 2022-12-24 DIAGNOSIS — M25642 Stiffness of left hand, not elsewhere classified: Secondary | ICD-10-CM

## 2022-12-24 DIAGNOSIS — G8929 Other chronic pain: Secondary | ICD-10-CM | POA: Diagnosis present

## 2022-12-24 DIAGNOSIS — M25512 Pain in left shoulder: Secondary | ICD-10-CM | POA: Diagnosis present

## 2022-12-24 DIAGNOSIS — M6281 Muscle weakness (generalized): Secondary | ICD-10-CM | POA: Diagnosis present

## 2022-12-24 DIAGNOSIS — R600 Localized edema: Secondary | ICD-10-CM | POA: Insufficient documentation

## 2022-12-24 NOTE — Therapy (Signed)
OUTPATIENT PHYSICAL THERAPY SHOULDER EVALUATION   Patient Name: Sherry Hammond MRN: [id number] DOB:October 08, 1961, 62 y.o., female Today's Date: 12/25/2022  END OF SESSION:  PT End of Session - 12/24/22 1241     Visit Number 3    Number of Visits 17    Date for PT Re-Evaluation 02/26/23    PT Start Time 0830    PT Stop Time 0915    PT Time Calculation (min) 45 min    Activity Tolerance Patient tolerated treatment well    Behavior During Therapy [facility] for tasks assessed/performed                Past Medical History:  Diagnosis Date   Allergy    Depression    Hyperlipidemia    Thyroid disease    Past Surgical History:  Procedure Laterality Date   CESAREAN SECTION     Patient Active Problem List   Diagnosis Date Noted   Bipolar disorder, in full remission, most recent episode mixed ([facility]) 04/09/2022   Moderate mixed hyperlipidemia not requiring statin therapy 04/09/2022   Annual physical exam 04/09/2022   Hypothyroidism 11/03/2021    PCP: Merita Norton T.   REFERRING PROVIDER: Melina Fiddler.  REFERRING DIAG: RTC tendinopathy  THERAPY DIAG:  Chronic left shoulder pain  Rationale for Evaluation and Treatment: Rehabilitation  ONSET DATE: May 2023  SUBJECTIVE:                                                                                                                                                                                      SUBJECTIVE STATEMENT: Pt reports she's feeling "really good". HEP is going well. Pt reports her pain is continuing to decrease and that she is able to move her arm more. She woke up this morning with mild soreness underneath her armpit. NPS: 1/10 currently.   PERTINENT HISTORY: Pt reports her L shoulder pain started in May 2023. No specific MOI. She started playing the cello in November 2022 and as months passed she noticed her ROM decrease. She stopped playing the cello in May 2023 due to onset of remittent L shoulder pain.  L shoulder pain has remained ISQ since onset. Pt went to the MD who told her she has "frozen shoulder". She had an MRI done and it was unremarkable. Pt mentions MD had her get a Cortizone shot 3 weeks ago but she feels like it didn't really help. ON/OFF: quick; low irritability. Pt reports her L shoulder pain "ebbs and flows" as the day goes on but she "doesn't have to baby it constantly". She is able to carry her granddaughter although it is slightly painful. Pt reports  putting on a jacket and anything with extension tends to aggravate her pain. Pt noted no previous episodes. 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 24-hr period: wakes up at a 5/10 on the NPS and will go down to a 2-3/10 when periodically throughout the day. She experiences pain when her L shoulder is compressed. Pt reports that her pain is located "over the top of her shoulder". Pt is R-hand dominant. No N/T. PMH: bipolar disorder. Pt reports she lives with her husband and has a 70 y.o. son. Pt denies N/V, B&B changes, unexplained weight loss, saddle paresthesia, fever, night sweats, or unrelenting night pain at this time.  PAIN:  Are you having pain? Yes: NPRS scale: C - 2/10; W - 7/10; B - 1//10 Pain location: Over the top of the L shoulder Pain description: Achy; occasionally sharp  Aggravating factors: Putting on jacket, anything that requires extension Relieving factors: Ice at night  PRECAUTIONS: None  WEIGHT BEARING RESTRICTIONS: No  FALLS:  Has patient fallen in last 6 months? 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 and a 72 year old son at home  PLOF: Independent  PATIENT GOALS: Get back to ADLs pain free  NEXT MD VISIT:   OBJECTIVE:   DIAGNOSTIC FINDINGS:  MRI was unremarkable  PATIENT SURVEYS:  FOTO 65  COGNITION: Overall cognitive status: Within functional limits for tasks  assessed     SENSATION: WFL  POSTURE/Observation: PT observed neutral posture at rest- no abnormalities Overhead flexion with scapular dyskinesis- shoulder hike/elevation with excessive upward rotation  UPPER EXTREMITY ROM:   Active ROM Right eval Left eval  Shoulder flexion WNL 148  Shoulder extension WNL 45  Shoulder abduction WNL 136  Shoulder adduction    Shoulder internal rotation WNL 30/45   30/45  36  Shoulder external rotation WNL 60  Elbow flexion    Elbow extension    Wrist flexion    Wrist extension    Wrist ulnar deviation    Wrist radial deviation    Wrist pronation    Wrist supination    (Blank rows = not tested)  Passive ROM Right eval Left eval  Shoulder flexion WNL 160  Shoulder extension WNL 56  Shoulder abduction WNL 150  Shoulder adduction    Shoulder internal rotation WNL 30/45   30/45  50  Shoulder external rotation WNL 70  Elbow flexion    Elbow extension    Wrist flexion    Wrist extension    Wrist ulnar deviation    Wrist radial deviation    Wrist pronation    Wrist supination    (Blank rows = not tested)  UPPER EXTREMITY MMT:  MMT Right eval Left eval  Shoulder flexion 5 5  Shoulder extension 5 4  Shoulder abduction 5 3+  Shoulder adduction    Shoulder internal rotation 5 5  Shoulder external rotation 5 5  Middle trapezius 5 4  Lower trapezius 5 4  Elbow flexion    Elbow extension    Wrist flexion    Wrist extension    Wrist ulnar deviation    Wrist radial deviation    Wrist pronation    Wrist supination    Grip strength (lbs)    (Blank rows = not tested)  *MMT grades presented in available ROM  SHOULDER SPECIAL TESTS: Impingement tests: Neer impingement test: negative and Resisted ER test: negative SLAP  lesions:  Not indicated Instability tests:  Not indicated Rotator cuff assessment:  Lateral jobe test: positive  ER Lag: Negative Anterior instability: Anterior apprehension test: negative  JOINT  MOBILITY TESTING:  Anterior glide: unremarkable Inferior glide: unremarkable Posterior glide: uncomfortable and a little painful; otherwise unremarkable  None of the glides felt better with repeated motion.    PALPATION:  Mild tenderness at insertion of the bicep   TODAY'S TREATMENT:                                                                                                                                         DATE: 12/24/22 Ther-ex: - UBE 41min FWD; 59min BWD L3 for gentle strengthening and mobility - Sleeper's stretch 2 x 30 secs (LUE only) - AAROM IR with pulleys x 12  - Wand PROM flx, abd, scaption, IR/ER, ext x 12 - Y on wall 2x 8 with good carry over - Pendulums with 4# DB x 10 clockwise and counterclockwise (bilaterally) - I's 5# x 12 (LUE only) - T's 2# x 12 (LUE only) - W's 2# x 12 (LUE only) - Low pec stretch 1 x 30 secs    12/17/22 Ther-ex:  UBE 25min FWD; 58min BWD L3 for gentle strengthening and mobility  Landmine AAROM flexion x12 with min cuing for scapulohumeral rhythm with good carry over; same set up for abd - more painful in abd with patient reporting concordant dicomfort  Supine snow angel x12 max overhead abd 121d on LUE Supine AAROM with dowel overhead flex with scapulohumeral rhythm focus x12 2secH in max overhead flex  Sidelying lat stretch 2x 30sec  Supine AAROM ER dowel flexion x12 2secH  Y on wall 2x 12 with good carry over of cuing for technique   Manual STM with trigger point release to latissimus, ant/lat deltoid complex, bicep brachi PROM abd and ER with post GHJ mob 3x 30sec for each   PATIENT EDUCATION: Education details: Pt was educated on diagnosis, prognosis, and POC. PT instructed and educated pt on HEP and she was able to demonstrate and verbally recall with the use of demonstrations and VC. Pt executed therex with proper form and technique with good carry over. Person educated: Patient Education method: Consulting civil engineer, Demonstration,  Verbal cues, and Handouts Education comprehension: returned demonstration  HOME EXERCISE PROGRAM: Standing flexion wall slides x 12 (1x/day) Standing abduction wall slides x 12 (1x/day)   Low pec stretch 1 x 30 sec hold (1x/day)  ASSESSMENT:  CLINICAL IMPRESSION: PT continued therex progression for increased scapulohumeral rhythm and mobility, and for reduced muscle tension and pain. Pt demonstrated excellent compliance to demonstrations, VC, and TC for proper form and technique for therex. Pt exhibited no increase in pain for the entirety of the session. Pt adhered well to the new therex introduced by the PT. Pt had a slight increase in mobility during session. Pt will continue to benefit  from skilled PT to address strength and mobility deficits to return to PLOF and improve QoL.     OBJECTIVE IMPAIRMENTS: decreased activity tolerance, decreased coordination, decreased endurance, decreased ROM, decreased strength, increased fascial restrictions, impaired flexibility, impaired UE functional use, improper body mechanics, postural dysfunction, and pain.   ACTIVITY LIMITATIONS: carrying, lifting, sleeping, bathing, dressing, reach over head, hygiene/grooming, and strumming  PARTICIPATION LIMITATIONS:  cleaning and laundry  playing cello  PERSONAL FACTORS:  N/A  REHAB POTENTIAL: Excellent  CLINICAL DECISION MAKING: Stable/uncomplicated  EVALUATION COMPLEXITY: Low   GOALS: Goals reviewed with patient? Yes  SHORT TERM GOALS: Target date: 01/14/23  Pt will execute HEP independently with proper form and technique in order to return to PLOF at home. Baseline: HEP given on 12/17/22 Goal status: INITIAL   LONG TERM GOALS: Target date: 02/11/23  Pt will increase FOTO score to 73 to demonstrate predicted increase in functional mobility to complete ADLs Baseline: 65 Goal status: INITIAL  2.  Pt will decrease worst pain as reported on NPS by at least 3 points in order to demonstrate  clinically significant reduction in pain. Baseline: NPS - 7/10 Goal status: INITIAL  3.  Pt will increase shoulder ABD MMT to 4/5 in order to demonstrate strength needed for ADLs Baseline: 3+/5 Goal status: INITIAL  4.  Pt will increase shoulder AROM WNL to demonstrate ROM needed for ADLs Baseline: 12/21/22 abd: 136d Goal status: INITIAL   PLAN:  PT FREQUENCY: 2x/week  PT DURATION: 8 weeks .  PLANNED INTERVENTIONS: Therapeutic exercises, Therapeutic activity, Neuromuscular re-education, Patient/Family education, Self Care, and Joint mobilization  PLAN FOR NEXT SESSION: Perform the following tests: belly press, ER lag sign, posterior capsule instability test, and Beighton score     Durwin Reges DPT Durwin Reges, PT 12/25/2022, 9:39 AM

## 2022-12-24 NOTE — Therapy (Signed)
[facility] [address] [place], [address] Phone: [phone]   Fax:  [phone]  Occupational Therapy Treatment  Patient Details  Name: RIELLE SCHLAUCH MRN: [id number] Date of Birth: 12-18-1960 Referring Provider (OT): Layne Benton, MD   Encounter Date: 12/24/2022   OT End of Session - 12/24/22 1229     Visit Number 2    Number of Visits 6    Date for OT Re-Evaluation 01/26/23    OT Start Time 1200    OT Stop Time 1225    OT Time Calculation (min) 25 min    Activity Tolerance Patient tolerated treatment well    Behavior During Therapy [facility] for tasks assessed/performed             Past Medical History:  Diagnosis Date   Allergy    Depression    Hyperlipidemia    Thyroid disease     Past Surgical History:  Procedure Laterality Date   CESAREAN SECTION      There were no vitals filed for this visit.   Subjective Assessment - 12/24/22 1203     Subjective  I still feel some pain in my finger when I straighten it - about 10 x day    Pertinent History Pt hand got hang up in dog leash about early Nov - and got pulled. resulting in painfull shoulder and 4th digit. Pt cont to have increase pain at 4th digit PIP and middle phalange -show old fx , ulnar collateral ligament intact - some soft tissue injury on ulnar side and volar plate.    Patient Stated Goals Want the pain and swelling in my ring finger better so I can use it with cooking, with my grandbaby, gripping and squeezing    Currently in Pain? Yes    Pain Score 2     Pain Location Finger (Comment which one)    Pain Orientation Left    Pain Descriptors / Indicators Tightness;Aching    Pain Type Acute pain                [facility] OT Assessment - 12/24/22 0001       Edema   Edema PIP decrease on the L 5.6 cm      Strength   Right Hand Lateral Pinch 14 lbs    Right Hand 3 Point Pinch 17 lbs    Left Hand Lateral Pinch 14 lbs    Left Hand 3  Point Pinch 14 lbs      Left Hand AROM   L Ring  MCP 0-90 90 Degrees    L Ring PIP 0-100 98 Degrees    L Ring DIP 0-70 70 Degrees              Pt making great progress see flowsheet  Pt still pain at end range composite fist and intrinsic fist  Edema decrease at PIP        OT Treatments/Exercises (OP) - 12/24/22 0001       LUE Fluidotherapy   Number Minutes Fluidotherapy 8 Minutes    LUE Fluidotherapy Location Hand    Comments decrease pain and stiffness             Patient had pain-free range of motion after fluidotherapy. can continue with contrast at home 3 times a day.  Patient fitted with a silicone sleeves to wear at nighttime for edema PIP as well as shorter ones during the day. To cont with  Tendon glides pain-free.10 reps She was educated on joint protection and modifications to avoid tight prolonged grasp.  Using larger joints and  enlarge grips.       OT Education - 12/24/22 1203     Education Details progress and home program    Person(s) Educated Patient    Methods Explanation;Demonstration;Tactile cues;Handout;Verbal cues    Comprehension Verbalized understanding;Returned demonstration              OT Short Term Goals - 12/15/22 1427       OT SHORT TERM GOAL #1   Title Patient to be independent in home program to decrease edema and tenderness and pain to increase PIP flexion    Baseline PIP flexion 85 degrees.  Edema PIP 0.5 cm with tenderness at ulnar PIP and dorsal middle phalanges.  Pain increased 3/10 at PIP during composite fist    Time 3    Period Weeks    Status New    Target Date 01/05/23               OT Long Term Goals - 12/15/22 1428       OT LONG TERM GOAL #1   Title Left fourth digit PIP flexion crease to within normal limits to be able to make a composite fist during ADL's pain free.    Baseline PIP flexion 85 degrees with pain 3/10.  Edema at PIP with tenderness over ulnar PIP and dorsal middle phalanges     Time 6    Period Weeks    Status New    Target Date 01/26/23      OT LONG TERM GOAL #2   Title Patient to be symptom-free with a L composite fist cooking, driving and playing with grandbabies    Baseline Dull pain 3/10 in fourth digit    Time 6    Period Weeks    Status New    Target Date 01/26/23                   Plan - 12/24/22 1229     Clinical Impression Statement Patient presented at OT evaluation  about 3 months out from sprain of left fourth digit.  Ulnar collateral ligament at left fourth PIP intact -do show some swelling over volar plate, and old fx into middle phalanges.  Patient mostly limited with pain and stiffness in composite tight fist.  Tenderness over the ulnar PIP with swelling 0.5 cm.  PIP flexion at 85.  Pt this date return for first follow up - PIP flexion increase with 13 degrees and edema decrease to 5.6 cm - increase flexion but still some pain with composite fist and end range intrinsicfist - but decrease with heat. Pt to cont with same HEP.  Patient was educated on modifications to to task while decreasing edema, pain and stiffness at fourth digit.  Patient limited in functional use of left hand mostly in composite fist.  Patient can benefit from OT services to decrease edema and pain and increased motion and strength to return to prior level of function.    OT Occupational Profile and History Problem Focused Assessment - Including review of records relating to presenting problem    Occupational performance deficits (Please refer to evaluation for details): ADL's;IADL's;Leisure;Play;Social Participation    Body Structure / Function / Physical Skills ADL;Edema;ROM;Strength;Flexibility;IADL;Pain;UE functional use    Rehab Potential Good    Clinical Decision Making Limited treatment options, no task modification necessary    Comorbidities Affecting Occupational  Performance: None    Modification or Assistance to Complete Evaluation  No modification of tasks or  assist necessary to complete eval    OT Frequency 1x / week    OT Duration 6 weeks    OT Treatment/Interventions Self-care/ADL training;Ultrasound;Contrast Bath;Fluidtherapy;Therapeutic exercise;Patient/family education;DME and/or AE instruction;Manual Therapy    Consulted and Agree with Plan of Care Patient             Patient will benefit from skilled therapeutic intervention in order to improve the following deficits and impairments:   Body Structure / Function / Physical Skills: ADL, Edema, ROM, Strength, Flexibility, IADL, Pain, UE functional use       Visit Diagnosis: Pain in left hand  Stiffness of left hand, not elsewhere classified  Localized edema  Muscle weakness (generalized)    Problem List Patient Active Problem List   Diagnosis Date Noted   Bipolar disorder, in full remission, most recent episode mixed ([place]) 04/09/2022   Moderate mixed hyperlipidemia not requiring statin therapy 04/09/2022   Annual physical exam 04/09/2022   Hypothyroidism 11/03/2021    Rosalyn Gess, OTR/L,CLT 12/24/2022, 12:33 PM  [facility] [address], [place], [address] Phone: [phone]   Fax:  [phone]  Name: SERIAH BROTZMAN MRN: [id number] Date of Birth: November 17, 1961

## 2022-12-25 ENCOUNTER — Encounter: Payer: Self-pay | Admitting: Physical Therapy

## 2022-12-29 ENCOUNTER — Ambulatory Visit: Payer: Commercial Managed Care - PPO | Admitting: Physical Therapy

## 2022-12-29 ENCOUNTER — Encounter: Payer: Self-pay | Admitting: Physical Therapy

## 2022-12-29 DIAGNOSIS — M25512 Pain in left shoulder: Secondary | ICD-10-CM | POA: Diagnosis not present

## 2022-12-29 DIAGNOSIS — G8929 Other chronic pain: Secondary | ICD-10-CM

## 2022-12-29 NOTE — Therapy (Signed)
OUTPATIENT PHYSICAL THERAPY SHOULDER EVALUATION   Patient Name: Sherry Hammond MRN: [id number] DOB:1961/03/14, 62 y.o., female Today's Date: 12/29/2022  END OF SESSION:  PT End of Session - 12/29/22 1353     Visit Number 4    Number of Visits 17    Date for PT Re-Evaluation 02/26/23    Authorization - Visit Number 4    Authorization - Number of Visits 10    Progress Note Due on Visit 10    PT Start Time 1350    PT Stop Time 1430    PT Time Calculation (min) 40 min    Activity Tolerance Patient tolerated treatment well    Behavior During Therapy [facility] for tasks assessed/performed                 Past Medical History:  Diagnosis Date   Allergy    Depression    Hyperlipidemia    Thyroid disease    Past Surgical History:  Procedure Laterality Date   CESAREAN SECTION     Patient Active Problem List   Diagnosis Date Noted   Bipolar disorder, in full remission, most recent episode mixed ([place]) 04/09/2022   Moderate mixed hyperlipidemia not requiring statin therapy 04/09/2022   Annual physical exam 04/09/2022   Hypothyroidism 11/03/2021    PCP: Tally Joe T.   REFERRING PROVIDER: Verner Chol.  REFERRING DIAG: RTC tendinopathy  THERAPY DIAG:  Chronic left shoulder pain  Rationale for Evaluation and Treatment: Rehabilitation  ONSET DATE: May 2023  SUBJECTIVE:                                                                                                                                                                                      SUBJECTIVE STATEMENT: Pt reports she's feeling better overall and thinks her shoulder is moving better. Decreased pain overall, currently 1/10.   PERTINENT HISTORY: Pt reports her L shoulder pain started in May 2023. No specific MOI. She started playing the cello in November 2022 and as months passed she noticed her ROM decrease. She stopped playing the cello in May 2023 due to onset of remittent L shoulder pain. L  shoulder pain has remained ISQ since onset. Pt went to the MD who told her she has "frozen shoulder". She had an MRI done and it was unremarkable. Pt mentions MD had her get a Cortizone shot 3 weeks ago but she feels like it didn't really help. ON/OFF: quick; low irritability. Pt reports her L shoulder pain "ebbs and flows" as the day goes on but she "doesn't have to baby it constantly". She is able to carry her granddaughter although it is slightly  painful. Pt reports putting on a jacket and anything with extension tends to aggravate her pain. Pt noted no previous episodes. 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 24-hr period: wakes up at a 5/10 on the NPS and will go down to a 2-3/10 when periodically throughout the day. She experiences pain when her L shoulder is compressed. Pt reports that her pain is located "over the top of her shoulder". Pt is R-hand dominant. No N/T. PMH: bipolar disorder. Pt reports she lives with her husband and has a 66 y.o. son. Pt denies N/V, B&B changes, unexplained weight loss, saddle paresthesia, fever, night sweats, or unrelenting night pain at this time.  PAIN:  Are you having pain? Yes: NPRS scale: C - 2/10; W - 7/10; B - 1//10 Pain location: Over the top of the L shoulder Pain description: Achy; occasionally sharp  Aggravating factors: Putting on jacket, anything that requires extension Relieving factors: Ice at night  PRECAUTIONS: None  WEIGHT BEARING RESTRICTIONS: No  FALLS:  Has patient fallen in last 6 months? 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 and a 86 year old son at home  PLOF: Independent  PATIENT GOALS: Get back to ADLs pain free  NEXT MD VISIT:   OBJECTIVE:   DIAGNOSTIC FINDINGS:  MRI was unremarkable  PATIENT SURVEYS:  FOTO 65  COGNITION: Overall cognitive status: Within functional limits for tasks  assessed     SENSATION: WFL  POSTURE/Observation: PT observed neutral posture at rest- no abnormalities Overhead flexion with scapular dyskinesis- shoulder hike/elevation with excessive upward rotation  UPPER EXTREMITY ROM:   Active ROM Right eval Left eval  Shoulder flexion WNL 148  Shoulder extension WNL 45  Shoulder abduction WNL 136  Shoulder adduction    Shoulder internal rotation WNL 30/45   30/45  36  Shoulder external rotation WNL 60  Elbow flexion    Elbow extension    Wrist flexion    Wrist extension    Wrist ulnar deviation    Wrist radial deviation    Wrist pronation    Wrist supination    (Blank rows = not tested)  Passive ROM Right eval Left eval  Shoulder flexion WNL 160  Shoulder extension WNL 56  Shoulder abduction WNL 150  Shoulder adduction    Shoulder internal rotation WNL 30/45   30/45  50  Shoulder external rotation WNL 70  Elbow flexion    Elbow extension    Wrist flexion    Wrist extension    Wrist ulnar deviation    Wrist radial deviation    Wrist pronation    Wrist supination    (Blank rows = not tested)  UPPER EXTREMITY MMT:  MMT Right eval Left eval  Shoulder flexion 5 5  Shoulder extension 5 4  Shoulder abduction 5 3+  Shoulder adduction    Shoulder internal rotation 5 5  Shoulder external rotation 5 5  Middle trapezius 5 4  Lower trapezius 5 4  Elbow flexion    Elbow extension    Wrist flexion    Wrist extension    Wrist ulnar deviation    Wrist radial deviation    Wrist pronation    Wrist supination    Grip strength (lbs)    (Blank rows = not tested)  *MMT grades presented in available ROM  SHOULDER SPECIAL TESTS: Impingement tests: Neer impingement test: negative and Resisted ER  test: negative SLAP lesions:  Not indicated Instability tests:  Not indicated Rotator cuff assessment:  Lateral jobe test: positive  ER Lag: Negative Anterior instability: Anterior apprehension test: negative  JOINT  MOBILITY TESTING:  Anterior glide: unremarkable Inferior glide: unremarkable Posterior glide: uncomfortable and a little painful; otherwise unremarkable  None of the glides felt better with repeated motion.    PALPATION:  Mild tenderness at insertion of the bicep   TODAY'S TREATMENT:                                                                                                                                         DATE: 12/24/22 Ther-ex: - UBE 60min FWD; 84min BWD L3 for gentle strengthening and mobility - [place] PVC AAROM overhead flex x12; abd x12 with observed excessive scapular upward rotation with overhead flex - Scaption at wall for cuing for efficient scapulohumeral rhythm x12; with 2# x12; 4# x8 with good carry over of technique - lat pulldown 20# x8; 10# x12 with cuing for scapulohumeral rhythm with good carry over -Standing abd 2# DB 2x 10 with good carry over of cuing for scapulohumeral rhythm - Low pec stretch 1 x 30 secs  Post capsule stretch x30secH     PATIENT EDUCATION: Education details: Pt was educated on diagnosis, prognosis, and POC. PT instructed and educated pt on HEP and she was able to demonstrate and verbally recall with the use of demonstrations and VC. Pt executed therex with proper form and technique with good carry over. Person educated: Patient Education method: Consulting civil engineer, Demonstration, Verbal cues, and Handouts Education comprehension: returned demonstration  HOME EXERCISE PROGRAM: Standing flexion wall slides x 12 (1x/day) Standing abduction wall slides x 12 (1x/day)   Low pec stretch 1 x 30 sec hold (1x/day)  ASSESSMENT:  CLINICAL IMPRESSION: PT continued therex progression for increased scapulohumeral rhythm and mobility, and for reduced muscle tension and pain. Pt with increased motion and decreased pain this session, allowing for increased progression of resistance exercise. Pt demonstrated excellent compliance to demonstrations, VC, and  TC for proper form and technique for therex. Pt exhibited no increase in pain for the entirety of the session. Pt adhered well to the new therex introduced by the PT. Pt had a slight increase in mobility during session. Pt will continue to benefit from skilled PT to address strength and mobility deficits to return to PLOF and improve QoL.     OBJECTIVE IMPAIRMENTS: decreased activity tolerance, decreased coordination, decreased endurance, decreased ROM, decreased strength, increased fascial restrictions, impaired flexibility, impaired UE functional use, improper body mechanics, postural dysfunction, and pain.   ACTIVITY LIMITATIONS: carrying, lifting, sleeping, bathing, dressing, reach over head, hygiene/grooming, and strumming  PARTICIPATION LIMITATIONS:  cleaning and laundry  playing cello  PERSONAL FACTORS:  N/A  REHAB POTENTIAL: Excellent  CLINICAL DECISION MAKING: Stable/uncomplicated  EVALUATION COMPLEXITY: Low   GOALS: Goals reviewed with patient? Yes  SHORT TERM GOALS: Target date: 01/14/23  Pt will execute HEP independently with proper form and technique in order to return to PLOF at home. Baseline: HEP given on 12/17/22 Goal status: INITIAL   LONG TERM GOALS: Target date: 02/11/23  Pt will increase FOTO score to 73 to demonstrate predicted increase in functional mobility to complete ADLs Baseline: 65 Goal status: INITIAL  2.  Pt will decrease worst pain as reported on NPS by at least 3 points in order to demonstrate clinically significant reduction in pain. Baseline: NPS - 7/10 Goal status: INITIAL  3.  Pt will increase shoulder ABD MMT to 4/5 in order to demonstrate strength needed for ADLs Baseline: 3+/5 Goal status: INITIAL  4.  Pt will increase shoulder AROM WNL to demonstrate ROM needed for ADLs Baseline: 12/21/22 abd: 136d Goal status: INITIAL   PLAN:  PT FREQUENCY: 2x/week  PT DURATION: 8 weeks .  PLANNED INTERVENTIONS: Therapeutic exercises,  Therapeutic activity, Neuromuscular re-education, Patient/Family education, Self Care, and Joint mobilization  PLAN FOR NEXT SESSION: Perform the following tests: belly press, ER lag sign, posterior capsule instability test, and Beighton score     Durwin Reges DPT Durwin Reges, PT 12/29/2022, 2:41 PM

## 2022-12-31 ENCOUNTER — Ambulatory Visit: Payer: Commercial Managed Care - PPO | Admitting: Physical Therapy

## 2022-12-31 ENCOUNTER — Encounter: Payer: Self-pay | Admitting: Physical Therapy

## 2022-12-31 DIAGNOSIS — M25512 Pain in left shoulder: Secondary | ICD-10-CM | POA: Diagnosis not present

## 2022-12-31 DIAGNOSIS — G8929 Other chronic pain: Secondary | ICD-10-CM

## 2022-12-31 NOTE — Therapy (Signed)
OUTPATIENT PHYSICAL THERAPY SHOULDER EVALUATION   Patient Name: Sherry Hammond MRN: [id number] DOB:[birth year], 62 y.o., female Today's Date: 12/31/2022  END OF SESSION:  PT End of Session - 12/31/22 1536     Visit Number 5    Number of Visits 18    Date for PT Re-Evaluation 02/26/23    PT Start Time 1430    PT Stop Time 1515    PT Time Calculation (min) 45 min    Activity Tolerance Patient tolerated treatment well    Behavior During Therapy [facility] for tasks assessed/performed                  Past Medical History:  Diagnosis Date   Allergy    Depression    Hyperlipidemia    Thyroid disease    Past Surgical History:  Procedure Laterality Date   CESAREAN SECTION     Patient Active Problem List   Diagnosis Date Noted   Bipolar disorder, in full remission, most recent episode mixed ([place]) 04/09/2022   Moderate mixed hyperlipidemia not requiring statin therapy 04/09/2022   Annual physical exam 04/09/2022   Hypothyroidism 11/03/2021    PCP: Tally Joe T.   REFERRING PROVIDER: Verner Chol.  REFERRING DIAG: RTC tendinopathy  THERAPY DIAG:  Chronic left shoulder pain  Rationale for Evaluation and Treatment: Rehabilitation  ONSET DATE: May 2023  SUBJECTIVE:                                                                                                                                                                                      SUBJECTIVE STATEMENT: Pt reports she's feeling "incredible". Pt mentions she is doing much better with overhead reaching - flexion and abduction. She still struggles with IR/ER but her pain is decreasing. HEP is going really well and she is doing Y's on the wall at home daily for mobility. NPS: 0/10 currently.  PERTINENT HISTORY: Pt reports her L shoulder pain started in May 2023. No specific MOI. She started playing the cello in November 2022 and as months passed she noticed her ROM decrease. She stopped playing the  cello in May 2023 due to onset of remittent L shoulder pain. L shoulder pain has remained ISQ since onset. Pt went to the MD who told her she has "frozen shoulder". She had an MRI done and it was unremarkable. Pt mentions MD had her get a Cortizone shot 3 weeks ago but she feels like it didn't really help. ON/OFF: quick; low irritability. Pt reports her L shoulder pain "ebbs and flows" as the day goes on but she "doesn't have to baby it constantly". She is able to carry  her granddaughter although it is slightly painful. Pt reports putting on a jacket and anything with extension tends to aggravate her pain. Pt noted no previous episodes. 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 24-hr period: wakes up at a 5/10 on the NPS and will go down to a 2-3/10 when periodically throughout the day. She experiences pain when her L shoulder is compressed. Pt reports that her pain is located "over the top of her shoulder". Pt is R-hand dominant. No N/T. PMH: bipolar disorder. Pt reports she lives with her husband and has a 55 y.o. son. Pt denies N/V, B&B changes, unexplained weight loss, saddle paresthesia, fever, night sweats, or unrelenting night pain at this time.  PAIN:  Are you having pain? Yes: NPRS scale: C - 2/10; W - 7/10; B - 1//10 Pain location: Over the top of the L shoulder Pain description: Achy; occasionally sharp  Aggravating factors: Putting on jacket, anything that requires extension Relieving factors: Ice at night  PRECAUTIONS: None  WEIGHT BEARING RESTRICTIONS: No  FALLS:  Has patient fallen in last 6 months? 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 and a 17 year old son at home  PLOF: Independent  PATIENT GOALS: Get back to ADLs pain free  NEXT MD VISIT:   OBJECTIVE:   DIAGNOSTIC FINDINGS:  MRI was unremarkable  PATIENT SURVEYS:  FOTO 65  COGNITION: Overall cognitive status: Within  functional limits for tasks assessed     SENSATION: WFL  POSTURE/Observation: PT observed neutral posture at rest- no abnormalities Overhead flexion with scapular dyskinesis- shoulder hike/elevation with excessive upward rotation  UPPER EXTREMITY ROM:   Active ROM Right eval Left eval  Shoulder flexion WNL 148  Shoulder extension WNL 45  Shoulder abduction WNL 136  Shoulder adduction    Shoulder internal rotation WNL 30/45   30/45  36  Shoulder external rotation WNL 60  Elbow flexion    Elbow extension    Wrist flexion    Wrist extension    Wrist ulnar deviation    Wrist radial deviation    Wrist pronation    Wrist supination    (Blank rows = not tested)  Passive ROM Right eval Left eval  Shoulder flexion WNL 160  Shoulder extension WNL 56  Shoulder abduction WNL 150  Shoulder adduction    Shoulder internal rotation WNL 30/45   30/45  50  Shoulder external rotation WNL 70  Elbow flexion    Elbow extension    Wrist flexion    Wrist extension    Wrist ulnar deviation    Wrist radial deviation    Wrist pronation    Wrist supination    (Blank rows = not tested)  UPPER EXTREMITY MMT:  MMT Right eval Left eval  Shoulder flexion 5 5  Shoulder extension 5 4  Shoulder abduction 5 3+  Shoulder adduction    Shoulder internal rotation 5 5  Shoulder external rotation 5 5  Middle trapezius 5 4  Lower trapezius 5 4  Elbow flexion    Elbow extension    Wrist flexion    Wrist extension    Wrist ulnar deviation    Wrist radial deviation    Wrist pronation    Wrist supination    Grip strength (lbs)    (Blank rows = not tested)  *MMT grades presented in available ROM  SHOULDER SPECIAL TESTS: Impingement tests: Neer  impingement test: negative and Resisted ER test: negative SLAP lesions:  Not indicated Instability tests:  Not indicated Rotator cuff assessment:  Lateral jobe test: positive  ER Lag: Negative Anterior instability: Anterior apprehension  test: negative  JOINT MOBILITY TESTING:  Anterior glide: unremarkable Inferior glide: unremarkable Posterior glide: uncomfortable and a little painful; otherwise unremarkable  None of the glides felt better with repeated motion.    PALPATION:  Mild tenderness at insertion of the bicep   TODAY'S TREATMENT:                                                                                                                                         DATE: 12/31/22 Ther-ex: - UBE 78mn FWD; 219m BWD L4 for gentle strengthening and mobility  - Landmine PVC AAROM overhead flex x12; abd x12 - Landmine PVC AAROM abd x 12 - Scaption at wall for cuing for efficient scapulohumeral rhythm x12; 5# x 6 with good carry over of technique - Lat pulldown 2x 8# x12; 10# x12 with cuing for scapulohumeral rhythm with good carry over - I's with 4# DB x 12 - T's with 3# DB x 8 - W's with 2# DB x 12 - Low pec stretch 1 x 30 secs  12/24/22 Ther-ex: - UBE 70m44mFWD; 70mi97mWD L3 for gentle strengthening and mobility - Landmine PVC AAROM overhead flex x12; abd x12 with observed excessive scapular upward rotation with overhead flex - Scaption at wall for cuing for efficient scapulohumeral rhythm x12; with 2# x12; 4# x8 with good carry over of technique - lat pulldown 20# x8; 10# x12 with cuing for scapulohumeral rhythm with good carry over -Standing abd 2# DB 2x 10 with good carry over of cuing for scapulohumeral rhythm - Low pec stretch 1 x 30 secs  Post capsule stretch x30secH     PATIENT EDUCATION: Education details: Pt was educated on diagnosis, prognosis, and POC. PT instructed and educated pt on HEP and she was able to demonstrate and verbally recall with the use of demonstrations and VC. Pt executed therex with proper form and technique with good carry over. Person educated: Patient Education method: ExplConsulting civil engineermonstration, Verbal cues, and Handouts Education comprehension: returned demonstration  HOME  EXERCISE PROGRAM: Standing flexion wall slides x 12 (1x/day) Standing abduction wall slides x 12 (1x/day)   Low pec stretch 1 x 30 sec hold (1x/day)  ASSESSMENT:  CLINICAL IMPRESSION: PT continued therex progression for increased scapulohumeral rhythm and mobility, and for reduced muscle tension and pain. Pt with in-session improvement with increased motion and decreased pain, allowing for continual increase in load. Pt demonstrated excellent compliance to demonstrations, VC, and TC for proper form and technique for new therex. Pt exhibited no increase in pain for the entirety of the session. Pt had increase in mobility during session. Pt will continue to benefit from skilled PT to address aforementioned deficits to return  to PLOF and improve QoL.   OBJECTIVE IMPAIRMENTS: decreased activity tolerance, decreased coordination, decreased endurance, decreased ROM, decreased strength, increased fascial restrictions, impaired flexibility, impaired UE functional use, improper body mechanics, postural dysfunction, and pain.   ACTIVITY LIMITATIONS: carrying, lifting, sleeping, bathing, dressing, reach over head, hygiene/grooming, and strumming  PARTICIPATION LIMITATIONS:  cleaning and laundry  playing cello  PERSONAL FACTORS:  N/A  REHAB POTENTIAL: Excellent  CLINICAL DECISION MAKING: Stable/uncomplicated  EVALUATION COMPLEXITY: Low   GOALS: Goals reviewed with patient? Yes  SHORT TERM GOALS: Target date: 01/14/23  Pt will execute HEP independently with proper form and technique in order to return to PLOF at home. Baseline: HEP given on 12/17/22 Goal status: INITIAL   LONG TERM GOALS: Target date: 02/11/23  Pt will increase FOTO score to 73 to demonstrate predicted increase in functional mobility to complete ADLs Baseline: 65 Goal status: INITIAL  2.  Pt will decrease worst pain as reported on NPS by at least 3 points in order to demonstrate clinically significant reduction in  pain. Baseline: NPS - 7/10 Goal status: INITIAL  3.  Pt will increase shoulder ABD MMT to 4/5 in order to demonstrate strength needed for ADLs Baseline: 3+/5 Goal status: INITIAL  4.  Pt will increase shoulder AROM WNL to demonstrate ROM needed for ADLs Baseline: 12/21/22 abd: 136d Goal status: INITIAL   PLAN:  PT FREQUENCY: 2x/week  PT DURATION: 8 weeks .  PLANNED INTERVENTIONS: Therapeutic exercises, Therapeutic activity, Neuromuscular re-education, Patient/Family education, Self Care, and Joint mobilization  PLAN FOR NEXT SESSION: Perform the following tests: belly press, ER lag sign, posterior capsule instability test, and Beighton score     Durwin Reges DPT [employer] [place], Student-PT 12/31/2022, 3:39 PM

## 2023-01-05 ENCOUNTER — Encounter: Payer: BC Managed Care – PPO | Admitting: Occupational Therapy

## 2023-01-06 ENCOUNTER — Encounter: Payer: Self-pay | Admitting: Physical Therapy

## 2023-01-06 ENCOUNTER — Ambulatory Visit: Payer: Commercial Managed Care - PPO | Admitting: Physical Therapy

## 2023-01-06 DIAGNOSIS — G8929 Other chronic pain: Secondary | ICD-10-CM

## 2023-01-06 DIAGNOSIS — M25512 Pain in left shoulder: Secondary | ICD-10-CM | POA: Diagnosis not present

## 2023-01-06 NOTE — Therapy (Signed)
OUTPATIENT PHYSICAL THERAPY SHOULDER EVALUATION   Patient Name: Sherry Hammond MRN: [id number] DOB:11-Aug-1961, 62 y.o., female Today's Date: 01/07/2023  END OF SESSION:  PT End of Session - 01/06/23 1007     Visit Number 6    Number of Visits 18    Date for PT Re-Evaluation 02/26/23    PT Start Time 0915    PT Stop Time 1000    PT Time Calculation (min) 45 min    Activity Tolerance Patient tolerated treatment well    Behavior During Therapy [facility] for tasks assessed/performed                   Past Medical History:  Diagnosis Date   Allergy    Depression    Hyperlipidemia    Thyroid disease    Past Surgical History:  Procedure Laterality Date   CESAREAN SECTION     Patient Active Problem List   Diagnosis Date Noted   Bipolar disorder, in full remission, most recent episode mixed ([place]) 04/09/2022   Moderate mixed hyperlipidemia not requiring statin therapy 04/09/2022   Annual physical exam 04/09/2022   Hypothyroidism 11/03/2021    PCP: Tally Joe T.   REFERRING PROVIDER: Verner Chol.  REFERRING DIAG: RTC tendinopathy  THERAPY DIAG:  Chronic left shoulder pain  Rationale for Evaluation and Treatment: Rehabilitation  ONSET DATE: May 2023  SUBJECTIVE:                                                                                                                                                                                      SUBJECTIVE STATEMENT: Pt reports she's feeling "really good". Pt states her ROM is continuing to increase. Showering, dressing, and ADLs at home are becoming much easier. HEP is continuing to go well. She continues to struggle with IR/ER making ADLs that require reaching behind her back moderately difficult. Pt reports ER brings on intermittent sharp pain. Pt reports she ROM has drastically increase. NPS: 1/10 currently; at home since last session her pain was 4/10.   PERTINENT HISTORY: Pt reports her L shoulder pain  started in May 2023. No specific MOI. She started playing the cello in November 2022 and as months passed she noticed her ROM decrease. She stopped playing the cello in May 2023 due to onset of remittent L shoulder pain. L shoulder pain has remained ISQ since onset. Pt went to the MD who told her she has "frozen shoulder". She had an MRI done and it was unremarkable. Pt mentions MD had her get a Cortizone shot 3 weeks ago but she feels like it didn't really help. ON/OFF: quick; low irritability. Pt reports  her L shoulder pain "ebbs and flows" as the day goes on but she "doesn't have to baby it constantly". She is able to carry her granddaughter although it is slightly painful. Pt reports putting on a jacket and anything with extension tends to aggravate her pain. Pt noted no previous episodes. 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 24-hr period: wakes up at a 5/10 on the NPS and will go down to a 2-3/10 when periodically throughout the day. She experiences pain when her L shoulder is compressed. Pt reports that her pain is located "over the top of her shoulder". Pt is R-hand dominant. No N/T. PMH: bipolar disorder. Pt reports she lives with her husband and has a 26 y.o. son. Pt denies N/V, B&B changes, unexplained weight loss, saddle paresthesia, fever, night sweats, or unrelenting night pain at this time.  PAIN:  Are you having pain? Yes: NPRS scale: C - 2/10; W - 7/10; B - 1//10 Pain location: Over the top of the L shoulder Pain description: Achy; occasionally sharp  Aggravating factors: Putting on jacket, anything that requires extension Relieving factors: Ice at night  PRECAUTIONS: None  WEIGHT BEARING RESTRICTIONS: No  FALLS:  Has patient fallen in last 6 months? No  LIVING ENVIRONMENT: Lives with: lives with their family  OCCUPATION: Has a husband and a 59 year old son at home  PLOF: Independent  PATIENT GOALS: Get back to ADLs pain  free  NEXT MD VISIT:   OBJECTIVE:   DIAGNOSTIC FINDINGS:  MRI was unremarkable  PATIENT SURVEYS:  FOTO 65  COGNITION: Overall cognitive status: Within functional limits for tasks assessed     SENSATION: WFL  POSTURE/Observation: PT observed neutral posture at rest- no abnormalities Overhead flexion with scapular dyskinesis- shoulder hike/elevation with excessive upward rotation  UPPER EXTREMITY ROM:   Active ROM Right eval Left eval  Shoulder flexion WNL 148  Shoulder extension WNL 45  Shoulder abduction WNL 136  Shoulder adduction    Shoulder internal rotation WNL 30/45   30/45  36  Shoulder external rotation WNL 60  Elbow flexion    Elbow extension    Wrist flexion    Wrist extension    Wrist ulnar deviation    Wrist radial deviation    Wrist pronation    Wrist supination    (Blank rows = not tested)  Passive ROM Right eval Left eval  Shoulder flexion WNL 160  Shoulder extension WNL 56  Shoulder abduction WNL 150  Shoulder adduction    Shoulder internal rotation WNL 30/45   30/45  50  Shoulder external rotation WNL 70  Elbow flexion    Elbow extension    Wrist flexion    Wrist extension    Wrist ulnar deviation    Wrist radial deviation    Wrist pronation    Wrist supination    (Blank rows = not tested)  UPPER EXTREMITY MMT:  MMT Right eval Left eval  Shoulder flexion 5 5  Shoulder extension 5 4  Shoulder abduction 5 3+  Shoulder adduction    Shoulder internal rotation 5 5  Shoulder external rotation 5 5  Middle trapezius 5 4  Lower trapezius 5 4  Elbow flexion    Elbow extension    Wrist flexion    Wrist extension    Wrist ulnar deviation    Wrist radial deviation    Wrist pronation    Wrist supination    Grip  strength (lbs)    (Blank rows = not tested)  *MMT grades presented in available ROM  SHOULDER SPECIAL TESTS: Impingement tests: Neer impingement test: negative and Resisted ER test: negative SLAP lesions:  Not  indicated Instability tests:  Not indicated Rotator cuff assessment:  Lateral jobe test: positive  ER Lag: Negative Anterior instability: Anterior apprehension test: negative  JOINT MOBILITY TESTING:  Anterior glide: unremarkable Inferior glide: unremarkable Posterior glide: uncomfortable and a little painful; otherwise unremarkable  None of the glides felt better with repeated motion.    PALPATION:  Mild tenderness at insertion of the bicep   TODAY'S TREATMENT:                                                                                                                                         DATE: 01/06/23 Therex: - UBE 3 min FWD; 3 min BWD L4 for gentle strengthening and mobility  - Landmine PVC AAROM overhead flex x 12 - Landmine PVC AAROM abd x 12 - I's with 5# DB x 8 - T's with 4# DB x 8 - W's with 3# DB x 12 - Serratus punches 10# KB x 12 - Prone Y's 2x x 12  12/31/22 Ther-ex: - UBE 34mn FWD; 257m BWD L3 for gentle strengthening and mobility  - Landmine PVC AAROM overhead flex x12; abd x12 - Landmine PVC AAROM abd x 12 - Scaption at wall for cuing for efficient scapulohumeral rhythm x12; 5# x 6 with good carry over of technique - Lat pulldown 2x 8# x12; 10# x12 with cuing for scapulohumeral rhythm with good carry over - I's with 4# DB x 12 - T's with 3# DB x 8 - W's with 2# DB x 12 - Low pec stretch 1 x 30 secs  12/24/22 Ther-ex: - UBE 63m54mFWD; 63mi78mWD L3 for gentle strengthening and mobility - Landmine PVC AAROM overhead flex x12; abd x12 with observed excessive scapular upward rotation with overhead flex - Scaption at wall for cuing for efficient scapulohumeral rhythm x12; with 2# x12; 4# x8 with good carry over of technique - lat pulldown 20# x8; 10# x12 with cuing for scapulohumeral rhythm with good carry over -Standing abd 2# DB 2x 10 with good carry over of cuing for scapulohumeral rhythm - Low pec stretch 1 x 30 secs  Post capsule stretch  x30secH     PATIENT EDUCATION: Education details: Pt was educated on diagnosis, prognosis, and POC. PT instructed and educated pt on HEP and she was able to demonstrate and verbally recall with the use of demonstrations and VC. Pt executed therex with proper form and technique with good carry over. Person educated: Patient Education method: Explanation, Demonstration, Verbal cues, and Handouts Education comprehension: returned demonstration  HOME EXERCISE PROGRAM: Standing flexion wall slides x 12 (1x/day) Standing abduction wall slides x 12 (1x/day)   Low pec stretch 1 x 30  sec hold (1x/day)  ASSESSMENT:  CLINICAL IMPRESSION: PT continued therex progression for increased scapulohumeral rhythm and mobility along with gross shoulder strength, and pain reduction. Pt with in-session improvement with increased flx, abd, and ER and decreased pain, allowing for continual increase in load. Pt demonstrated excellent compliance to demonstrations, VC, and TC for proper form and technique for new therex. Pt exhibited no increase in pain for the entirety of the session. Pt had increase in mobility during session. Pt will continue to benefit from skilled PT to address aforementioned deficits to return to PLOF and improve QoL.   OBJECTIVE IMPAIRMENTS: decreased activity tolerance, decreased coordination, decreased endurance, decreased ROM, decreased strength, increased fascial restrictions, impaired flexibility, impaired UE functional use, improper body mechanics, postural dysfunction, and pain.   ACTIVITY LIMITATIONS: carrying, lifting, sleeping, bathing, dressing, reach over head, hygiene/grooming, and strumming  PARTICIPATION LIMITATIONS:  cleaning and laundry  playing cello  PERSONAL FACTORS:  N/A  REHAB POTENTIAL: Excellent  CLINICAL DECISION MAKING: Stable/uncomplicated  EVALUATION COMPLEXITY: Low   GOALS: Goals reviewed with patient? Yes  SHORT TERM GOALS: Target date: 01/14/23  Pt  will execute HEP independently with proper form and technique in order to return to PLOF at home. Baseline: HEP given on 12/17/22 Goal status: INITIAL   LONG TERM GOALS: Target date: 02/11/23  Pt will increase FOTO score to 73 to demonstrate predicted increase in functional mobility to complete ADLs Baseline: 65 Goal status: INITIAL  2.  Pt will decrease worst pain as reported on NPS by at least 3 points in order to demonstrate clinically significant reduction in pain. Baseline: NPS - 7/10 Goal status: INITIAL  3.  Pt will increase shoulder ABD MMT to 4/5 in order to demonstrate strength needed for ADLs Baseline: 3+/5 Goal status: INITIAL  4.  Pt will increase shoulder AROM WNL to demonstrate ROM needed for ADLs Baseline: 12/21/22 abd: 136d Goal status: INITIAL   PLAN:  PT FREQUENCY: 2x/week  PT DURATION: 8 weeks .  PLANNED INTERVENTIONS: Therapeutic exercises, Therapeutic activity, Neuromuscular re-education, Patient/Family education, Self Care, and Joint mobilization  PLAN FOR NEXT SESSION: Perform the following tests: belly press, ER lag sign, posterior capsule instability test, and Beighton score     Durwin Reges DPT Durwin Reges, PT 01/07/2023, 12:15 PM

## 2023-01-08 ENCOUNTER — Ambulatory Visit: Payer: Commercial Managed Care - PPO | Admitting: Occupational Therapy

## 2023-01-08 ENCOUNTER — Encounter: Payer: Self-pay | Admitting: Physical Therapy

## 2023-01-08 ENCOUNTER — Ambulatory Visit: Payer: Commercial Managed Care - PPO | Admitting: Physical Therapy

## 2023-01-08 DIAGNOSIS — M79642 Pain in left hand: Secondary | ICD-10-CM

## 2023-01-08 DIAGNOSIS — G8929 Other chronic pain: Secondary | ICD-10-CM

## 2023-01-08 DIAGNOSIS — M25642 Stiffness of left hand, not elsewhere classified: Secondary | ICD-10-CM

## 2023-01-08 DIAGNOSIS — R600 Localized edema: Secondary | ICD-10-CM

## 2023-01-08 DIAGNOSIS — M25512 Pain in left shoulder: Secondary | ICD-10-CM | POA: Diagnosis not present

## 2023-01-08 NOTE — Therapy (Unsigned)
OUTPATIENT PHYSICAL THERAPY SHOULDER EVALUATION   Patient Name: Sherry Hammond MRN: [id number] DOB:1961/08/05, 62 y.o., female Today's Date: 01/08/2023  END OF SESSION:  PT End of Session - 01/08/23 1038     Visit Number 7    Number of Visits 18    Date for PT Re-Evaluation 02/26/23    PT Start Time 1042    PT Stop Time 1120    PT Time Calculation (min) 38 min    Activity Tolerance Patient tolerated treatment well    Behavior During Therapy [facility] for tasks assessed/performed                    Past Medical History:  Diagnosis Date   Allergy    Depression    Hyperlipidemia    Thyroid disease    Past Surgical History:  Procedure Laterality Date   CESAREAN SECTION     Patient Active Problem List   Diagnosis Date Noted   Bipolar disorder, in full remission, most recent episode mixed ([place]) 04/09/2022   Moderate mixed hyperlipidemia not requiring statin therapy 04/09/2022   Annual physical exam 04/09/2022   Hypothyroidism 11/03/2021    PCP: Tally Joe T.   REFERRING PROVIDER: Verner Chol.  REFERRING DIAG: RTC tendinopathy  THERAPY DIAG:  Chronic left shoulder pain  Rationale for Evaluation and Treatment: Rehabilitation  ONSET DATE: May 2023  SUBJECTIVE:                                                                                                                                                                                      SUBJECTIVE STATEMENT: Pt reports she's feeling "amazing". Pt states she has been able to hang clothes on the clothesline with her LUE with no pain. HEP is continuing to go well. She continues to have mild pain in posterior aspect of deltoid with abduction and ER/IR. Pt has noticed continual improvements in her ROM. NPS: 1/10 currently.     PERTINENT HISTORY: Pt reports her L shoulder pain started in May 2023. No specific MOI. She started playing the cello in November 2022 and as months passed she noticed her ROM  decrease. She stopped playing the cello in May 2023 due to onset of remittent L shoulder pain. L shoulder pain has remained ISQ since onset. Pt went to the MD who told her she has "frozen shoulder". She had an MRI done and it was unremarkable. Pt mentions MD had her get a Cortizone shot 3 weeks ago but she feels like it didn't really help. ON/OFF: quick; low irritability. Pt reports her L shoulder pain "ebbs and flows" as the day goes on but she "doesn't  have to baby it constantly". She is able to carry her granddaughter although it is slightly painful. Pt reports putting on a jacket and anything with extension tends to aggravate her pain. Pt noted no previous episodes. 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 24-hr period: wakes up at a 5/10 on the NPS and will go down to a 2-3/10 when periodically throughout the day. She experiences pain when her L shoulder is compressed. Pt reports that her pain is located "over the top of her shoulder". Pt is R-hand dominant. No N/T. PMH: bipolar disorder. Pt reports she lives with her husband and has a 58 y.o. son. Pt denies N/V, B&B changes, unexplained weight loss, saddle paresthesia, fever, night sweats, or unrelenting night pain at this time.  PAIN:  Are you having pain? Yes: NPRS scale: C - 2/10; W - 7/10; B - 1//10 Pain location: Over the top of the L shoulder Pain description: Achy; occasionally sharp  Aggravating factors: Putting on jacket, anything that requires extension Relieving factors: Ice at night  PRECAUTIONS: None  WEIGHT BEARING RESTRICTIONS: No  FALLS:  Has patient fallen in last 6 months? No  LIVING ENVIRONMENT: Lives with: lives with their family  OCCUPATION: Has a husband and a 61 year old son at home  PLOF: Independent  PATIENT GOALS: Get back to ADLs pain free  NEXT MD VISIT:   OBJECTIVE:   DIAGNOSTIC FINDINGS:  MRI was unremarkable  PATIENT SURVEYS:  FOTO  65  COGNITION: Overall cognitive status: Within functional limits for tasks assessed     SENSATION: WFL  POSTURE/Observation: PT observed neutral posture at rest- no abnormalities Overhead flexion with scapular dyskinesis- shoulder hike/elevation with excessive upward rotation  UPPER EXTREMITY ROM:   Active ROM Right eval Left eval  Shoulder flexion WNL 148  Shoulder extension WNL 45  Shoulder abduction WNL 136  Shoulder adduction    Shoulder internal rotation WNL 30/45   30/45  36  Shoulder external rotation WNL 60  Elbow flexion    Elbow extension    Wrist flexion    Wrist extension    Wrist ulnar deviation    Wrist radial deviation    Wrist pronation    Wrist supination    (Blank rows = not tested)  Passive ROM Right eval Left eval  Shoulder flexion WNL 160  Shoulder extension WNL 56  Shoulder abduction WNL 150  Shoulder adduction    Shoulder internal rotation WNL 30/45   30/45  50  Shoulder external rotation WNL 70  Elbow flexion    Elbow extension    Wrist flexion    Wrist extension    Wrist ulnar deviation    Wrist radial deviation    Wrist pronation    Wrist supination    (Blank rows = not tested)  UPPER EXTREMITY MMT:  MMT Right eval Left eval  Shoulder flexion 5 5  Shoulder extension 5 4  Shoulder abduction 5 3+  Shoulder adduction    Shoulder internal rotation 5 5  Shoulder external rotation 5 5  Middle trapezius 5 4  Lower trapezius 5 4  Elbow flexion    Elbow extension    Wrist flexion    Wrist extension    Wrist ulnar deviation    Wrist radial deviation    Wrist pronation    Wrist supination    Grip strength (lbs)    (Blank rows = not tested)  *MMT grades presented in  available ROM  SHOULDER SPECIAL TESTS: Impingement tests: Neer impingement test: negative and Resisted ER test: negative SLAP lesions:  Not indicated Instability tests:  Not indicated Rotator cuff assessment:  Lateral jobe test: positive  ER Lag:  Negative Anterior instability: Anterior apprehension test: negative  JOINT MOBILITY TESTING:  Anterior glide: unremarkable Inferior glide: unremarkable Posterior glide: uncomfortable and a little painful; otherwise unremarkable  None of the glides felt better with repeated motion.    PALPATION:  Mild tenderness at insertion of the bicep   TODAY'S TREATMENT:                                                                                                                                         DATE: 01/08/23 Therex:  - UBE 3 min FWD; 3 min BWD L4 for gentle strengthening and mobility  - Landmine PVC AAROM overhead flex x 12 - Landmine PVC AAROM abd x 12 - Body blade flx, abd, ER/IR 1 x 30 secs  - I's with 5# DB x 12 - T's with 4# DB x 12 - Y's with 5# DB 12 - Serratus punches 10# KB x 12 - Prone Y's x 12 with cuing for scapular retraction  01/06/23 Therex: - UBE 3 min FWD; 3 min BWD L4 for gentle strengthening and mobility  - Landmine PVC AAROM overhead flex x 12 - Landmine PVC AAROM abd x 12 - I's with 5# DB x 8 - T's with 4# DB x 8 - W's with 3# DB x 12 - Serratus punches 10# KB x 12 - Prone Y's 2x x 12  12/31/22 Ther-ex: - UBE 30mn FWD; 211m BWD L3 for gentle strengthening and mobility  - Landmine PVC AAROM overhead flex x12; abd x12 - Landmine PVC AAROM abd x 12 - Scaption at wall for cuing for efficient scapulohumeral rhythm x12; 5# x 6 with good carry over of technique - Lat pulldown 2x 8# x12; 10# x12 with cuing for scapulohumeral rhythm with good carry over - I's with 4# DB x 12 - T's with 3# DB x 8 - W's with 2# DB x 12 - Low pec stretch 1 x 30 secs  12/24/22 Ther-ex: - UBE 7m4mFWD; 7mi96mWD L3 for gentle strengthening and mobility - Landmine PVC AAROM overhead flex x12; abd x12 with observed excessive scapular upward rotation with overhead flex - Scaption at wall for cuing for efficient scapulohumeral rhythm x12; with 2# x12; 4# x8 with good carry over of  technique - lat pulldown 20# x8; 10# x12 with cuing for scapulohumeral rhythm with good carry over -Standing abd 2# DB 2x 10 with good carry over of cuing for scapulohumeral rhythm - Low pec stretch 1 x 30 secs  Post capsule stretch x30secH     PATIENT EDUCATION: Education details: Pt was educated on diagnosis, prognosis, and POC. PT instructed and educated pt on HEP and she  was able to demonstrate and verbally recall with the use of demonstrations and VC. Pt executed therex with proper form and technique with good carry over. Person educated: Patient Education method: Consulting civil engineer, Demonstration, Verbal cues, and Handouts Education comprehension: returned demonstration  HOME EXERCISE PROGRAM: Standing flexion wall slides x 12 (1x/day) Standing abduction wall slides x 12 (1x/day)   Low pec stretch 1 x 30 sec hold (1x/day)  ASSESSMENT:  CLINICAL IMPRESSION: PT continued therex progression for increased scapulohumeral rhythm and mobility along with gross shoulder strength, and pain reduction. Pt with in-session improvement in ROM with increased flx, abd, and ER and decreased pain, allowing for continual increase in load. Pt with between-session improvement in strength in LUE flx and scaption. Pt demonstrated excellent compliance to demonstrations, VC, and TC for proper form and technique for new therex. Pt exhibited no increase in pain for the entirety of the session. NPS: 0/10 at the end of session. PT will continue to progress as able. Pt will continue to benefit from skilled PT to address aforementioned deficits to return to PLOF and improve QoL.   OBJECTIVE IMPAIRMENTS: decreased activity tolerance, decreased coordination, decreased endurance, decreased ROM, decreased strength, increased fascial restrictions, impaired flexibility, impaired UE functional use, improper body mechanics, postural dysfunction, and pain.   ACTIVITY LIMITATIONS: carrying, lifting, sleeping, bathing, dressing,  reach over head, hygiene/grooming, and strumming  PARTICIPATION LIMITATIONS:  cleaning and laundry  playing cello  PERSONAL FACTORS:  N/A  REHAB POTENTIAL: Excellent  CLINICAL DECISION MAKING: Stable/uncomplicated  EVALUATION COMPLEXITY: Low   GOALS: Goals reviewed with patient? Yes  SHORT TERM GOALS: Target date: 01/14/23  Pt will execute HEP independently with proper form and technique in order to return to PLOF at home. Baseline: HEP given on 12/17/22 Goal status: INITIAL   LONG TERM GOALS: Target date: 02/11/23  Pt will increase FOTO score to 73 to demonstrate predicted increase in functional mobility to complete ADLs Baseline: 65 Goal status: INITIAL  2.  Pt will decrease worst pain as reported on NPS by at least 3 points in order to demonstrate clinically significant reduction in pain. Baseline: NPS - 7/10 Goal status: INITIAL  3.  Pt will increase shoulder ABD MMT to 4/5 in order to demonstrate strength needed for ADLs Baseline: 3+/5 Goal status: INITIAL  4.  Pt will increase shoulder AROM WNL to demonstrate ROM needed for ADLs Baseline: 12/21/22 abd: 136d Goal status: INITIAL   PLAN:  PT FREQUENCY: 2x/week  PT DURATION: 8 weeks .  PLANNED INTERVENTIONS: Therapeutic exercises, Therapeutic activity, Neuromuscular re-education, Patient/Family education, Self Care, and Joint mobilization  PLAN FOR NEXT SESSION: Perform the following tests: belly press, ER lag sign, posterior capsule instability test, and Beighton score     Durwin Reges DPT [facility], Student-PT 01/08/2023, 11:19 AM

## 2023-01-08 NOTE — Therapy (Signed)
[facility] [address] [place], [address] Phone: [phone]   Fax:  [phone]  Occupational Therapy Treatment  Patient Details  Name: Sherry Hammond MRN: [id number] Date of Birth: 06/14/1961 Referring Provider (OT): Layne [place], MD   Encounter Date: 01/08/2023   OT End of Session - 01/08/23 1228     Visit Number 3    Number of Visits 6    Date for OT Re-Evaluation 01/26/23    OT Start Time 1007    OT Stop Time 1042    OT Time Calculation (min) 35 min    Activity Tolerance Patient tolerated treatment well    Behavior During Therapy [facility] for tasks assessed/performed             Past Medical History:  Diagnosis Date   Allergy    Depression    Hyperlipidemia    Thyroid disease     Past Surgical History:  Procedure Laterality Date   CESAREAN SECTION      There were no vitals filed for this visit.   Subjective Assessment - 01/08/23 1227     Subjective  I was doing good for about 2 weeks now.  Pain was much better.  And then this morning when I was putting lotion on to my hands I felt a little bit.  All when I scratch myself or pull the refrigerator door open I can still feel a little bit.  But less than 5 x a day    Pertinent History Pt hand got hang up in dog leash about early Nov - and got pulled. resulting in painfull shoulder and 4th digit. Pt cont to have increase pain at 4th digit PIP and middle phalange -show old fx , ulnar collateral ligament intact - some soft tissue injury on ulnar side and volar plate.    Patient Stated Goals Want the pain and swelling in my ring finger better so I can use it with cooking, with my grandbaby, gripping and squeezing    Currently in Pain? No/denies                [facility] OT Assessment - 01/08/23 0001       Strength   Right Hand Grip (lbs) 70    Right Hand Lateral Pinch 14 lbs    Right Hand 3 Point Pinch 17 lbs    Left Hand Grip (lbs) 66    Left Hand  Lateral Pinch 16 lbs    Left Hand 3 Point Pinch 14 lbs      Left Hand AROM   L Ring  MCP 0-90 90 Degrees    L Ring PIP 0-100 98 Degrees    L Ring DIP 0-70 70 Degrees               Patient making great progress in active range of motion.  Pain less with composite and intrinsic fist.  Only time she feels discomfort is at the PIP when intrinsic a fist is getting a resistance like pulling the refrigerator door or scratching.  As well as a lateral torque on the PIP joint.   Still 1 area at PIP joint still in large but measuring about the same than the right hand.               Patient can can continue with contrast at home in the morning and as needed during the day. She can stop wearing compression sleeves.  Continue with tendon glides  pain-free.10 reps Will continue as needed with  joint protection and modifications to avoid tight prolonged grasp.  Using larger joints and  enlarge grips.                     OT Education - 01/08/23 1228     Education Details progress and home program    Person(s) Educated Patient    Methods Explanation;Demonstration;Tactile cues;Handout;Verbal cues    Comprehension Verbalized understanding;Returned demonstration              OT Short Term Goals - 01/08/23 1236       OT SHORT TERM GOAL #1   Title Patient to be independent in home program to decrease edema and tenderness and pain to increase PIP flexion    Status Achieved               OT Long Term Goals - 01/08/23 1236       OT LONG TERM GOAL #1   Title Left fourth digit PIP flexion crease to within normal limits to be able to make a composite fist during ADL's pain free.    Baseline PIP flexion 85 degrees with pain 3/10.  Edema at PIP with tenderness over ulnar PIP and dorsal middle phalanges NOW composite fist no pain - resistance agains intrinsic fist 1-2/10 like scratching or pulling refrigarator door    Status Achieved      OT LONG TERM GOAL #2    Title Patient to be symptom-free with a L composite fist cooking, driving and playing with grandbabies    Baseline Still some pain when pressure against intrinsic fist -and lateral pressure against middle phalanges    Time 4    Period Weeks    Status On-going    Target Date 01/26/23                   Plan - 01/08/23 1229     Clinical Impression Statement Patient presented at OT evaluation  about 3 1/2 months out from sprain of left fourth digit.  Ulnar collateral ligament at left fourth PIP intact -do show some swelling over volar plate, and old fx into middle phalanges.  Patient mostly limited with pain and stiffness in composite tight fist.  Tenderness over the ulnar PIP with swelling 0.5 cm.  PIP flexion at 85.  Pt made great progress in 3 session in decrease edema, pain and increase ROM and grip/prehension strength.  Pt feels like she is about 80-85% back to using it normally. Pt to cont with same HEP for 2-4 wks but stop wearing the compression sleeves -and use hand functionally. Will discharge pt in month if I don't hear from her.    OT Occupational Profile and History Problem Focused Assessment - Including review of records relating to presenting problem    Occupational performance deficits (Please refer to evaluation for details): ADL's;IADL's;Leisure;Play;Social Participation    Body Structure / Function / Physical Skills ADL;Edema;ROM;Strength;Flexibility;IADL;Pain;UE functional use    Rehab Potential Good    Clinical Decision Making Limited treatment options, no task modification necessary    Comorbidities Affecting Occupational Performance: None    Modification or Assistance to Complete Evaluation  No modification of tasks or assist necessary to complete eval    OT Frequency Monthly    OT Duration 4 weeks    OT Treatment/Interventions Self-care/ADL training;Ultrasound;Contrast Bath;Fluidtherapy;Therapeutic exercise;Patient/family education;DME and/or AE instruction;Manual  Therapy    Consulted and Agree with Plan of Care Patient  Patient will benefit from skilled therapeutic intervention in order to improve the following deficits and impairments:   Body Structure / Function / Physical Skills: ADL, Edema, ROM, Strength, Flexibility, IADL, Pain, UE functional use       Visit Diagnosis: Pain in left hand  Stiffness of left hand, not elsewhere classified  Localized edema    Problem List Patient Active Problem List   Diagnosis Date Noted   Bipolar disorder, in full remission, most recent episode mixed ([place]) 04/09/2022   Moderate mixed hyperlipidemia not requiring statin therapy 04/09/2022   Annual physical exam 04/09/2022   Hypothyroidism 11/03/2021    Rosalyn Gess, OTR/L,CLT 01/08/2023, 12:40 PM  [facility] [address], [place], [address] Phone: [phone]   Fax:  [phone]  Name: Sherry Hammond MRN: [id number] Date of Birth: 1961-08-14

## 2023-01-12 ENCOUNTER — Ambulatory Visit: Payer: Commercial Managed Care - PPO

## 2023-01-12 ENCOUNTER — Encounter: Payer: Self-pay | Admitting: Physical Therapy

## 2023-01-12 DIAGNOSIS — G8929 Other chronic pain: Secondary | ICD-10-CM

## 2023-01-12 DIAGNOSIS — M25512 Pain in left shoulder: Secondary | ICD-10-CM | POA: Diagnosis not present

## 2023-01-12 NOTE — Therapy (Unsigned)
OUTPATIENT PHYSICAL THERAPY SHOULDER TREATMENT   Patient Name: Sherry Hammond MRN: [id number] DOB:[birth year], 62 y.o., female Today's Date: 01/13/2023  END OF SESSION:  PT End of Session - 01/12/23 1434     Visit Number 8    Number of Visits 18    Date for PT Re-Evaluation 02/26/23    PT Start Time 1430    PT Stop Time 1510    PT Time Calculation (min) 40 min    Activity Tolerance Patient tolerated treatment well    Behavior During Therapy [facility] for tasks assessed/performed                     Past Medical History:  Diagnosis Date   Allergy    Depression    Hyperlipidemia    Thyroid disease    Past Surgical History:  Procedure Laterality Date   CESAREAN SECTION     Patient Active Problem List   Diagnosis Date Noted   Bipolar disorder, in full remission, most recent episode mixed ([place]) 04/09/2022   Moderate mixed hyperlipidemia not requiring statin therapy 04/09/2022   Annual physical exam 04/09/2022   Hypothyroidism 11/03/2021    PCP: Tally Joe T.   REFERRING PROVIDER: Verner Chol.  REFERRING DIAG: RTC tendinopathy  THERAPY DIAG:  Chronic left shoulder pain  Rationale for Evaluation and Treatment: Rehabilitation  ONSET DATE: May 2023  SUBJECTIVE:                                                                                                                                                                                      SUBJECTIVE STATEMENT: Pt reports she's feeling "incredible". Pt states she was able to put on her apron for the first time since May of 2023. HEP is continuing to go well. She continues to have mild pain in posterior aspect of deltoid with abduction and ER/IR. Pt has noticed slight improvements in ER/IR ROM. She experienced some soreness from last session but it subsided after a day. NPS: 0/10 currently.    PERTINENT HISTORY: Pt reports her L shoulder pain started in May 2023. No specific MOI. She started playing  the cello in November 2022 and as months passed she noticed her ROM decrease. She stopped playing the cello in May 2023 due to onset of remittent L shoulder pain. L shoulder pain has remained ISQ since onset. Pt went to the MD who told her she has "frozen shoulder". She had an MRI done and it was unremarkable. Pt mentions MD had her get a Cortizone shot 3 weeks ago but she feels like it didn't really help. ON/OFF: quick; low irritability. Pt reports her L  shoulder pain "ebbs and flows" as the day goes on but she "doesn't have to baby it constantly". She is able to carry her granddaughter although it is slightly painful. Pt reports putting on a jacket and anything with extension tends to aggravate her pain. Pt noted no previous episodes. 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 24-hr period: wakes up at a 5/10 on the NPS and will go down to a 2-3/10 when periodically throughout the day. She experiences pain when her L shoulder is compressed. Pt reports that her pain is located "over the top of her shoulder". Pt is R-hand dominant. No N/T. PMH: bipolar disorder. Pt reports she lives with her husband and has a 36 y.o. son. Pt denies N/V, B&B changes, unexplained weight loss, saddle paresthesia, fever, night sweats, or unrelenting night pain at this time.  PAIN:  Are you having pain? Yes: NPRS scale: C - 2/10; W - 7/10; B - 1//10 Pain location: Over the top of the L shoulder Pain description: Achy; occasionally sharp  Aggravating factors: Putting on jacket, anything that requires extension Relieving factors: Ice at night  PRECAUTIONS: None  WEIGHT BEARING RESTRICTIONS: No  FALLS:  Has patient fallen in last 6 months? No  LIVING ENVIRONMENT: Lives with: lives with their family  OCCUPATION: Has a husband and a 53 year old son at home  PLOF: Independent  PATIENT GOALS: Get back to ADLs pain free  NEXT MD VISIT:   OBJECTIVE:   DIAGNOSTIC FINDINGS:   MRI was unremarkable  PATIENT SURVEYS:  FOTO 65  COGNITION: Overall cognitive status: Within functional limits for tasks assessed     SENSATION: WFL  POSTURE/Observation: PT observed neutral posture at rest- no abnormalities Overhead flexion with scapular dyskinesis- shoulder hike/elevation with excessive upward rotation  UPPER EXTREMITY ROM:   Active ROM Right eval Left eval  Shoulder flexion WNL 148  Shoulder extension WNL 45  Shoulder abduction WNL 136  Shoulder adduction    Shoulder internal rotation WNL 30/45   30/45  36  Shoulder external rotation WNL 60  Elbow flexion    Elbow extension    Wrist flexion    Wrist extension    Wrist ulnar deviation    Wrist radial deviation    Wrist pronation    Wrist supination    (Blank rows = not tested)  Passive ROM Right eval Left eval  Shoulder flexion WNL 160  Shoulder extension WNL 56  Shoulder abduction WNL 150  Shoulder adduction    Shoulder internal rotation WNL 30/45   30/45  50  Shoulder external rotation WNL 70  Elbow flexion    Elbow extension    Wrist flexion    Wrist extension    Wrist ulnar deviation    Wrist radial deviation    Wrist pronation    Wrist supination    (Blank rows = not tested)  UPPER EXTREMITY MMT:  MMT Right eval Left eval  Shoulder flexion 5 5  Shoulder extension 5 4  Shoulder abduction 5 3+  Shoulder adduction    Shoulder internal rotation 5 5  Shoulder external rotation 5 5  Middle trapezius 5 4  Lower trapezius 5 4  Elbow flexion    Elbow extension    Wrist flexion    Wrist extension    Wrist ulnar deviation    Wrist radial deviation    Wrist pronation    Wrist supination    Grip strength (lbs)    (  Blank rows = not tested)  *MMT grades presented in available ROM  SHOULDER SPECIAL TESTS: Impingement tests: Neer impingement test: negative and Resisted ER test: negative SLAP lesions:  Not indicated Instability tests:  Not indicated Rotator cuff  assessment:  Lateral jobe test: positive  ER Lag: Negative Anterior instability: Anterior apprehension test: negative  JOINT MOBILITY TESTING:  Anterior glide: unremarkable Inferior glide: unremarkable Posterior glide: uncomfortable and a little painful; otherwise unremarkable  None of the glides felt better with repeated motion.    PALPATION:  Mild tenderness at insertion of the bicep   TODAY'S TREATMENT:                                                                                                                                         DATE: 01/12/23 Therex:  - UBE seat 9 3 min FWD; 3 min BWD L4 for gentle strengthening and mobility - Landmine PVC AAROM overhead flex x 12 - Landmine PVC AAROM abd x 12 - Body blade flx, abd, ER/IR 1 x 30 secs - I's with 6# DB x 12  - T's with 4# DB x 12 - Y's with 6# DB x 12 - Low pec stretch 1 x 30 secs  01/08/23 Therex:  - UBE 3 min FWD; 3 min BWD L4 for gentle strengthening and mobility  - Landmine PVC AAROM overhead flex x 12 - Landmine PVC AAROM abd x 12 - Body blade flx, abd, ER/IR 1 x 30 secs  - I's with 5# DB x 12 - T's with 4# DB x 12 - Y's with 5# DB 12 - Serratus punches 10# KB x 12 - Prone Y's x 12 with cuing for scapular retraction  01/06/23 Therex: - UBE 3 min FWD; 3 min BWD L4 for gentle strengthening and mobility  - Landmine PVC AAROM overhead flex x 12 - Landmine PVC AAROM abd x 12 - I's with 5# DB x 8 - T's with 4# DB x 8 - W's with 3# DB x 12 - Serratus punches 10# KB x 12 - Prone Y's 2x x 12  12/31/22 Ther-ex: - UBE 41mn FWD; 215m BWD L3 for gentle strengthening and mobility  - Landmine PVC AAROM overhead flex x12; abd x12 - Landmine PVC AAROM abd x 12 - Scaption at wall for cuing for efficient scapulohumeral rhythm x12; 5# x 6 with good carry over of technique - Lat pulldown 2x 8# x12; 10# x12 with cuing for scapulohumeral rhythm with good carry over - I's with 4# DB x 12 - T's with 3# DB x 8 - W's with 2#  DB x 12 - Low pec stretch 1 x 30 secs  12/24/22 Ther-ex: - UBE 79m39mFWD; 79mi64mWD L3 for gentle strengthening and mobility - Landmine PVC AAROM overhead flex x12; abd x12 with observed excessive scapular upward rotation with overhead flex - Scaption at wall for cuing  for efficient scapulohumeral rhythm x12; with 2# x12; 4# x8 with good carry over of technique - lat pulldown 20# x8; 10# x12 with cuing for scapulohumeral rhythm with good carry over -Standing abd 2# DB 2x 10 with good carry over of cuing for scapulohumeral rhythm - Low pec stretch 1 x 30 secs  Post capsule stretch x30secH     PATIENT EDUCATION: Education details: Pt was educated on diagnosis, prognosis, and POC. PT instructed and educated pt on HEP and she was able to demonstrate and verbally recall with the use of demonstrations and VC. Pt executed therex with proper form and technique with good carry over. Person educated: Patient Education method: Consulting civil engineer, Demonstration, Verbal cues, and Handouts Education comprehension: returned demonstration  HOME EXERCISE PROGRAM: Standing flexion wall slides x 12 (1x/day) Standing abduction wall slides x 12 (1x/day)   Low pec stretch 1 x 30 sec hold (1x/day)  ASSESSMENT:  CLINICAL IMPRESSION: PT continued therex progression for increased scapulohumeral rhythm and mobility along with gross shoulder strength, and pain reduction. Pt with in-session improvement in ROM with increased flx, abd, and ER and decreased pain, allowing for continual increase in load. Pt with between-session improvement in strength in LUE flx, abd, ER/IR, and scaption. Pt demonstrated excellent compliance to demonstrations, VC, and TC for proper form and technique for new therex. Pt exhibited no increase in pain for the entirety of the session. NPS: 0/10 at the end of session. PT will continue to progress as able. Pt will continue to benefit from skilled PT to address aforementioned deficits to return to PLOF  and improve QoL.   OBJECTIVE IMPAIRMENTS: decreased activity tolerance, decreased coordination, decreased endurance, decreased ROM, decreased strength, increased fascial restrictions, impaired flexibility, impaired UE functional use, improper body mechanics, postural dysfunction, and pain.   ACTIVITY LIMITATIONS: carrying, lifting, sleeping, bathing, dressing, reach over head, hygiene/grooming, and strumming  PARTICIPATION LIMITATIONS:  cleaning and laundry  playing cello  PERSONAL FACTORS:  N/A  REHAB POTENTIAL: Excellent  CLINICAL DECISION MAKING: Stable/uncomplicated  EVALUATION COMPLEXITY: Low   GOALS: Goals reviewed with patient? Yes  SHORT TERM GOALS: Target date: 01/14/23  Pt will execute HEP independently with proper form and technique in order to return to PLOF at home. Baseline: HEP given on 12/17/22 Goal status: INITIAL   LONG TERM GOALS: Target date: 02/11/23  Pt will increase FOTO score to 73 to demonstrate predicted increase in functional mobility to complete ADLs Baseline: 65 Goal status: INITIAL  2.  Pt will decrease worst pain as reported on NPS by at least 3 points in order to demonstrate clinically significant reduction in pain. Baseline: NPS - 7/10 Goal status: INITIAL  3.  Pt will increase shoulder ABD MMT to 4/5 in order to demonstrate strength needed for ADLs Baseline: 3+/5 Goal status: INITIAL  4.  Pt will increase shoulder AROM WNL to demonstrate ROM needed for ADLs Baseline: 12/21/22 abd: 136d Goal status: INITIAL   PLAN:  PT FREQUENCY: 2x/week  PT DURATION: 8 weeks .  PLANNED INTERVENTIONS: Therapeutic exercises, Therapeutic activity, Neuromuscular re-education, Patient/Family education, Self Care, and Joint mobilization  PLAN FOR NEXT SESSION: Perform the following tests: belly press, ER lag sign, posterior capsule instability test, and Beighton score     Stanford Scotland, SPT   Salem Caster. Fairly IV, PT, DPT Physical Therapist- [facility]  01/13/2023, 7:59 AM

## 2023-01-14 ENCOUNTER — Ambulatory Visit: Payer: Commercial Managed Care - PPO | Admitting: Physical Therapy

## 2023-01-19 ENCOUNTER — Encounter: Payer: Self-pay | Admitting: Physical Therapy

## 2023-01-19 ENCOUNTER — Ambulatory Visit: Payer: Commercial Managed Care - PPO | Admitting: Physical Therapy

## 2023-01-19 DIAGNOSIS — G8929 Other chronic pain: Secondary | ICD-10-CM

## 2023-01-19 DIAGNOSIS — M25512 Pain in left shoulder: Secondary | ICD-10-CM | POA: Diagnosis not present

## 2023-01-19 NOTE — Therapy (Signed)
OUTPATIENT PHYSICAL THERAPY SHOULDER TREATMENT   Patient Name: Sherry Hammond MRN: [id number] DOB:Sep 15, 1961, 62 y.o., female Today's Date: 01/21/2023  END OF SESSION:             Past Medical History:  Diagnosis Date   Allergy    Depression    Hyperlipidemia    Thyroid disease    Past Surgical History:  Procedure Laterality Date   CESAREAN SECTION     Patient Active Problem List   Diagnosis Date Noted   Bipolar disorder, in full remission, most recent episode mixed ([place]) 04/09/2022   Moderate mixed hyperlipidemia not requiring statin therapy 04/09/2022   Annual physical exam 04/09/2022   Hypothyroidism 11/03/2021    PCP: Tally Joe T.   REFERRING PROVIDER: Verner Chol.  REFERRING DIAG: RTC tendinopathy  THERAPY DIAG:  Chronic left shoulder pain  Rationale for Evaluation and Treatment: Rehabilitation  ONSET DATE: May 2023  SUBJECTIVE:                                                                                                                                                                                      SUBJECTIVE STATEMENT: Pt reports she is feeling "great". Pt states showering and dressing are both continuing to become easier and painless. Pt reports she "hardly feels any shoulder pain" throughout the day. NPRS worst pain to date since eval: 2-3/10. HEP is continuing to go well. She still has mild pain in posterior aspect of deltoid with abduction and ER/IR. Pt has noticed continued slight improvements in ER/IR ROM. NPS: 0/10 currently.    PERTINENT HISTORY: Pt reports her L shoulder pain started in May 2023. No specific MOI. She started playing the cello in November 2022 and as months passed she noticed her ROM decrease. She stopped playing the cello in May 2023 due to onset of remittent L shoulder pain. L shoulder pain has remained ISQ since onset. Pt went to the MD who told her she has "frozen shoulder". She had an MRI done and it  was unremarkable. Pt mentions MD had her get a Cortizone shot 3 weeks ago but she feels like it didn't really help. ON/OFF: quick; low irritability. Pt reports her L shoulder pain "ebbs and flows" as the day goes on but she "doesn't have to baby it constantly". She is able to carry her granddaughter although it is slightly painful. Pt reports putting on a jacket and anything with extension tends to aggravate her pain. Pt noted no previous episodes. 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 24-hr period: wakes up at a 5/10 on the NPS and will  go down to a 2-3/10 when periodically throughout the day. She experiences pain when her L shoulder is compressed. Pt reports that her pain is located "over the top of her shoulder". Pt is R-hand dominant. No N/T. PMH: bipolar disorder. Pt reports she lives with her husband and has a 65 y.o. son. Pt denies N/V, B&B changes, unexplained weight loss, saddle paresthesia, fever, night sweats, or unrelenting night pain at this time.  PAIN:  Are you having pain? Yes: NPRS scale: C - 2/10; W - 7/10; B - 1//10 Pain location: Over the top of the L shoulder Pain description: Achy; occasionally sharp  Aggravating factors: Putting on jacket, anything that requires extension Relieving factors: Ice at night  PRECAUTIONS: None  WEIGHT BEARING RESTRICTIONS: No  FALLS:  Has patient fallen in last 6 months? No  LIVING ENVIRONMENT: Lives with: lives with their family  OCCUPATION: Has a husband and a 39 year old son at home  PLOF: Independent  PATIENT GOALS: Get back to ADLs pain free  NEXT MD VISIT:   OBJECTIVE:   DIAGNOSTIC FINDINGS:  MRI was unremarkable  PATIENT SURVEYS:  FOTO 65  COGNITION: Overall cognitive status: Within functional limits for tasks assessed     SENSATION: WFL  POSTURE/Observation: PT observed neutral posture at rest- no abnormalities Overhead flexion with scapular dyskinesis- shoulder  hike/elevation with excessive upward rotation  UPPER EXTREMITY ROM:   Active ROM Right eval Left eval  Shoulder flexion WNL 148  Shoulder extension WNL 45  Shoulder abduction WNL 136  Shoulder adduction    Shoulder internal rotation WNL 30/45   30/45  36  Shoulder external rotation WNL 60  Elbow flexion    Elbow extension    Wrist flexion    Wrist extension    Wrist ulnar deviation    Wrist radial deviation    Wrist pronation    Wrist supination    (Blank rows = not tested)  Passive ROM Right eval Left eval  Shoulder flexion WNL 160  Shoulder extension WNL 56  Shoulder abduction WNL 150  Shoulder adduction    Shoulder internal rotation WNL 30/45   30/45  50  Shoulder external rotation WNL 70  Elbow flexion    Elbow extension    Wrist flexion    Wrist extension    Wrist ulnar deviation    Wrist radial deviation    Wrist pronation    Wrist supination    (Blank rows = not tested)  UPPER EXTREMITY MMT:  MMT Right eval Left eval  Shoulder flexion 5 5  Shoulder extension 5 4  Shoulder abduction 5 3+  Shoulder adduction    Shoulder internal rotation 5 5  Shoulder external rotation 5 5  Middle trapezius 5 4  Lower trapezius 5 4  Elbow flexion    Elbow extension    Wrist flexion    Wrist extension    Wrist ulnar deviation    Wrist radial deviation    Wrist pronation    Wrist supination    Grip strength (lbs)    (Blank rows = not tested)  *MMT grades presented in available ROM  SHOULDER SPECIAL TESTS: Impingement tests: Neer impingement test: negative and Resisted ER test: negative SLAP lesions:  Not indicated Instability tests:  Not indicated Rotator cuff assessment:  Lateral jobe test: positive  ER Lag: Negative Anterior instability: Anterior apprehension test: negative  JOINT MOBILITY TESTING:  Anterior glide: unremarkable Inferior glide: unremarkable Posterior glide: uncomfortable and a little painful; otherwise unremarkable  None of  the glides felt better with repeated motion.    PALPATION:  Mild tenderness at insertion of the bicep   TODAY'S TREATMENT:                                                                                                                                         DATE: 01/19/23 - UBE seat 9 2.5 min FWD; 2.5 min BWD L4 for gentle strengthening and mobility - Landmine PVC AAROM overhead flex x 12 - Landmine PVC AAROM abd x 12 - Y's on wall (scaption) with 3# DB x 12 - I's with 5# DB x 12 with eccentric control - Bent over row with GreyTB x 12 with good carry over - Serratus punches 10# KB x 12 with TC for scapular mvmt   Last session: 01/12/23 Therex:  - UBE seat 9 3 min FWD; 3 min BWD L4 for gentle strengthening and mobility - Landmine PVC AAROM overhead flex x 12 - Landmine PVC AAROM abd x 12 - Body blade flx, abd, ER/IR 1 x 30 secs - I's with 6# DB x 12  - T's with 4# DB x 12 - Y's with 6# DB x 12 - Low pec stretch 1 x 30 secs  01/08/23 Therex:  - UBE 3 min FWD; 3 min BWD L4 for gentle strengthening and mobility  - Landmine PVC AAROM overhead flex x 12 - Landmine PVC AAROM abd x 12 - Body blade flx, abd, ER/IR 1 x 30 secs  - I's with 5# DB x 12 - T's with 4# DB x 12 - Y's with 5# DB 12 - Serratus punches 10# KB x 12 - Prone Y's x 12 with cuing for scapular retraction  01/06/23 Therex: - UBE 2.5 min FWD; 2.5 min BWD L4 for gentle strengthening and mobility  - Landmine PVC AAROM overhead flex x 12 - Landmine PVC AAROM abd x 12 - I's with 5# DB x 8 - T's with 4# DB x 8 - W's with 3# DB x 12 - Serratus punches 10# KB x 12 - Prone Y's 2x x 12 - Low pec stretch 1 x 30 secs  12/31/22 Ther-ex: - UBE 7mn FWD; 248m BWD L3 for gentle strengthening and mobility  - Landmine PVC AAROM overhead flex x12; abd x12 - Landmine PVC AAROM abd x 12 - Scaption at wall for cuing for efficient scapulohumeral rhythm x12; 5# x 6 with good carry over of technique - Lat pulldown 2x 8# x12; 10# x12  with cuing for scapulohumeral rhythm with good carry over - I's with 4# DB x 12 - T's with 3# DB x 8 - W's with 2# DB x 12 - Low pec stretch 1 x 30 secs  12/24/22 Ther-ex: - UBE 41m20mFWD; 41mi36mWD L3 for gentle strengthening and mobility - Landmine PVC AAROM overhead flex x12; abd x12 with observed excessive  scapular upward rotation with overhead flex - Scaption at wall for cuing for efficient scapulohumeral rhythm x12; with 2# x12; 4# x8 with good carry over of technique - lat pulldown 20# x8; 10# x12 with cuing for scapulohumeral rhythm with good carry over -Standing abd 2# DB 2x 10 with good carry over of cuing for scapulohumeral rhythm - Low pec stretch 1 x 30 secs  Post capsule stretch x30secH     PATIENT EDUCATION: Education details: Pt was educated on diagnosis, prognosis, and POC. PT instructed and educated pt on HEP and she was able to demonstrate and verbally recall with the use of demonstrations and VC. Pt executed therex with proper form and technique with good carry over. Person educated: Patient Education method: Explanation, Demonstration, Verbal cues, and Handouts Education comprehension: returned demonstration  HOME EXERCISE PROGRAM: Access Code: [email or web address] URL: [email or web address] Date: 01/19/2023 Prepared by: [place] [facility] at [place] - 1-2 x weekly - 3 sets - 3-12 reps - Standing Full Range Shoulder Flexion with Dumbbells - 1-2 x weekly - 3 sets - 8-12 reps - Supine Scapular Protraction in Flexion with Dumbbells - 1-2 x weekly - 3 sets - 8-12 reps - Standing Bent Over Shoulder Row with Resistance - 1-2 x weekly - 3 sets - 8-12 reps - Barbell Landmine Press - 1-2 x daily - 7 x weekly - 12-20 reps  ASSESSMENT:  CLINICAL IMPRESSION: PT reassessed pt progress toward goals where patient has met all goals- remaining deficit in AROM/PROM ER. PT measured AROM ER at [place]. PT updated long-term goals. PT discussed with pt decreasing  the frequency of future sessions to 1x/week in order to promote independence toward discharge- pt agreed to [facility]. PT educated pt on updated HEP geared towards LUE increased scapulohumeral rhythm, mobility, and strengthening with success. Pt is able to comply with VC, TC, and demonstrations used for proper form and technique of updated HEP. Pt did not experience any increase in pain with any of the therex and gave excellent effort. Pt would continue to benefit from skilled PT to promote optimal return to PLOF and ADLs without pain.  OBJECTIVE IMPAIRMENTS: decreased activity tolerance, decreased coordination, decreased endurance, decreased ROM, decreased strength, increased fascial restrictions, impaired flexibility, impaired UE functional use, improper body mechanics, postural dysfunction, and pain.   ACTIVITY LIMITATIONS: carrying, lifting, sleeping, bathing, dressing, reach over head, hygiene/grooming, and strumming  PARTICIPATION LIMITATIONS:  cleaning and laundry  playing cello  PERSONAL FACTORS:  N/A  REHAB POTENTIAL: Excellent  CLINICAL DECISION MAKING: Stable/uncomplicated  EVALUATION COMPLEXITY: Low   GOALS: Goals reviewed with patient? Yes  SHORT TERM GOALS: Target date: 01/14/23  Pt will execute HEP independently with proper form and technique in order to return to PLOF at home. Baseline: HEP given on 12/17/22 Goal status: INITIAL   LONG TERM GOALS: Target date: 02/11/23  Pt will increase FOTO score to 73 to demonstrate predicted increase in functional mobility to complete ADLs Baseline: 65; 01/19/23: 79 Goal status: MET  2.  Pt will decrease worst pain as reported on NPS by at least 3 points in order to demonstrate clinically significant reduction in pain. Baseline: NPS - 7/10; 01/19/23: NPS - 2-3/10 Goal status: MET  3.  Pt will increase shoulder ABD MMT to 4/5 in order to demonstrate strength needed for ADLs Baseline: 3+/5; 01/19/23: 4/5 Goal status: MET  4.  Pt will  increase shoulder AROM WNL to demonstrate ROM needed for ADLs Baseline: 12/21/22 abd: 136d; 01/19/23:  175d Goal status: MET  5.  Pt will increase shoulder AROM WNL to demonstrate ROM needed for ADLs Baseline: 01/19/23: ER: 62d  Goal status: INITIAL    PLAN:  PT FREQUENCY: 2x/week  PT DURATION: 8 weeks .  PLANNED INTERVENTIONS: Therapeutic exercises, Therapeutic activity, Neuromuscular re-education, Patient/Family education, Self Care, and Joint mobilization  PLAN FOR NEXT SESSION: Perform the following tests: belly press, ER lag sign, posterior capsule instability test, and Beighton score     Stanford Scotland, SPT   Salem Caster. Fairly IV, PT, DPT Physical Therapist- [facility]  01/21/2023, 1:37 PM

## 2023-01-22 ENCOUNTER — Ambulatory Visit: Payer: Commercial Managed Care - PPO | Admitting: Physical Therapy

## 2023-01-25 ENCOUNTER — Ambulatory Visit: Payer: Commercial Managed Care - PPO | Admitting: Physical Therapy

## 2023-01-27 ENCOUNTER — Ambulatory Visit: Payer: Commercial Managed Care - PPO | Attending: Sports Medicine | Admitting: Physical Therapy

## 2023-01-27 ENCOUNTER — Encounter: Payer: Self-pay | Admitting: Physical Therapy

## 2023-01-27 DIAGNOSIS — G8929 Other chronic pain: Secondary | ICD-10-CM | POA: Diagnosis present

## 2023-01-27 DIAGNOSIS — M25512 Pain in left shoulder: Secondary | ICD-10-CM | POA: Insufficient documentation

## 2023-01-27 DIAGNOSIS — M6281 Muscle weakness (generalized): Secondary | ICD-10-CM | POA: Diagnosis present

## 2023-01-27 NOTE — Therapy (Signed)
OUTPATIENT PHYSICAL THERAPY SHOULDER TREATMENT/DC SUmmary   Patient Name: Sherry Hammond MRN: [id number] DOB:December 15, 1960, 62 y.o., female Today's Date: 01/27/2023  END OF SESSION:  PT End of Session - 01/27/23 1303     Visit Number 10    Number of Visits 18    Date for PT Re-Evaluation 02/26/23    Authorization - Visit Number 5    Authorization - Number of Visits 10    Progress Note Due on Visit 10    PT Start Time [id number]    PT Stop Time 1343    PT Time Calculation (min) 40 min    Activity Tolerance Patient tolerated treatment well    Behavior During Therapy [facility] for tasks assessed/performed                       Past Medical History:  Diagnosis Date   Allergy    Depression    Hyperlipidemia    Thyroid disease    Past Surgical History:  Procedure Laterality Date   CESAREAN SECTION     Patient Active Problem List   Diagnosis Date Noted   Bipolar disorder, in full remission, most recent episode mixed ([place]) 04/09/2022   Moderate mixed hyperlipidemia not requiring statin therapy 04/09/2022   Annual physical exam 04/09/2022   Hypothyroidism 11/03/2021    PCP: Tally Joe T.   REFERRING PROVIDER: Verner Chol.  REFERRING DIAG: RTC tendinopathy  THERAPY DIAG:  Chronic left shoulder pain  Muscle weakness (generalized)  Rationale for Evaluation and Treatment: Rehabilitation  ONSET DATE: May 2023  SUBJECTIVE:                                                                                                                                                                                      SUBJECTIVE STATEMENT: Pt reports no pain on arrival. Reports she is 80% better overall, feels like she is missing a little overhead motion and behind the back motion.     PERTINENT HISTORY: Pt reports her L shoulder pain started in May 2023. No specific MOI. She started playing the cello in November 2022 and as months passed she noticed her ROM decrease. She  stopped playing the cello in May 2023 due to onset of remittent L shoulder pain. L shoulder pain has remained ISQ since onset. Pt went to the MD who told her she has "frozen shoulder". She had an MRI done and it was unremarkable. Pt mentions MD had her get a Cortizone shot 3 weeks ago but she feels like it didn't really help. ON/OFF: quick; low irritability. Pt reports her L shoulder pain "ebbs and flows" as the day  goes on but she "doesn't have to baby it constantly". She is able to carry her granddaughter although it is slightly painful. Pt reports putting on a jacket and anything with extension tends to aggravate her pain. Pt noted no previous episodes. 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 24-hr period: wakes up at a 5/10 on the NPS and will go down to a 2-3/10 when periodically throughout the day. She experiences pain when her L shoulder is compressed. Pt reports that her pain is located "over the top of her shoulder". Pt is R-hand dominant. No N/T. PMH: bipolar disorder. Pt reports she lives with her husband and has a 45 y.o. son. Pt denies N/V, B&B changes, unexplained weight loss, saddle paresthesia, fever, night sweats, or unrelenting night pain at this time.  PAIN:  Are you having pain? Yes: NPRS scale: C - 2/10; W - 7/10; B - 1//10 Pain location: Over the top of the L shoulder Pain description: Achy; occasionally sharp  Aggravating factors: Putting on jacket, anything that requires extension Relieving factors: Ice at night  PRECAUTIONS: None  WEIGHT BEARING RESTRICTIONS: No  FALLS:  Has patient fallen in last 6 months? No  LIVING ENVIRONMENT: Lives with: lives with their family  OCCUPATION: Has a husband and a 48 year old son at home  PLOF: Independent  PATIENT GOALS: Get back to ADLs pain free  NEXT MD VISIT:   OBJECTIVE:   DIAGNOSTIC FINDINGS:  MRI was unremarkable  PATIENT SURVEYS:  FOTO 65  COGNITION: Overall  cognitive status: Within functional limits for tasks assessed     SENSATION: WFL  POSTURE/Observation: PT observed neutral posture at rest- no abnormalities Overhead flexion with scapular dyskinesis- shoulder hike/elevation with excessive upward rotation  UPPER EXTREMITY ROM:   Active ROM Right eval Left eval  Shoulder flexion WNL 148  Shoulder extension WNL 45  Shoulder abduction WNL 136  Shoulder adduction    Shoulder internal rotation WNL 30/45   30/45  36  Shoulder external rotation WNL 60  Elbow flexion    Elbow extension    Wrist flexion    Wrist extension    Wrist ulnar deviation    Wrist radial deviation    Wrist pronation    Wrist supination    (Blank rows = not tested)  Passive ROM Right eval Left eval  Shoulder flexion WNL 160  Shoulder extension WNL 56  Shoulder abduction WNL 150  Shoulder adduction    Shoulder internal rotation WNL 30/45   30/45  50  Shoulder external rotation WNL 70  Elbow flexion    Elbow extension    Wrist flexion    Wrist extension    Wrist ulnar deviation    Wrist radial deviation    Wrist pronation    Wrist supination    (Blank rows = not tested)  UPPER EXTREMITY MMT:  MMT Right eval Left eval  Shoulder flexion 5 5  Shoulder extension 5 4  Shoulder abduction 5 3+  Shoulder adduction    Shoulder internal rotation 5 5  Shoulder external rotation 5 5  Middle trapezius 5 4  Lower trapezius 5 4  Elbow flexion    Elbow extension    Wrist flexion    Wrist extension    Wrist ulnar deviation    Wrist radial deviation    Wrist pronation    Wrist supination    Grip strength (lbs)    (Blank rows = not tested)  *  MMT grades presented in available ROM  SHOULDER SPECIAL TESTS: Impingement tests: Neer impingement test: negative and Resisted ER test: negative SLAP lesions:  Not indicated Instability tests:  Not indicated Rotator cuff assessment:  Lateral jobe test: positive  ER Lag: Negative Anterior instability:  Anterior apprehension test: negative  JOINT MOBILITY TESTING:  Anterior glide: unremarkable Inferior glide: unremarkable Posterior glide: uncomfortable and a little painful; otherwise unremarkable  None of the glides felt better with repeated motion.    PALPATION:  Mild tenderness at insertion of the bicep   TODAY'S TREATMENT:                                                                                                                                         DATE: 01/19/23 - UBE seat 9 2.5 min FWD; 2.5 min BWD L4 for gentle strengthening and mobility  PT reviewed the following HEP with patient with patient able to demonstrate a set of the following with min cuing for correction needed. PT educated patient on parameters of therex (how/when to inc/decrease intensity, frequency, rep/set range, stretch hold time, and purpose of therex) with verbalized understanding.   Access Code: [email or web address] - [place] Setting at [place]  - 1-2 x weekly - 3 sets - 3-12 reps - Standing Full Range Shoulder Flexion with Dumbbells  - 1-2 x weekly - 3 sets - 8-12 reps - Supine Scapular Protraction in Flexion with Dumbbells  - 1-2 x weekly - 3 sets - 8-12 reps - Standing Bent Over Shoulder Row with Resistance  - 1-2 x weekly - 3 sets - 8-12 reps - Barbell Landmine Press  - 1-2 x daily - 7 x weekly - 12-20 reps - Standing Shoulder Internal Rotation Stretch with Towel  - 1-2 x daily - 7 x weekly - 12-20 sets - 2sec hold    PATIENT EDUCATION: Education details: Pt was educated on diagnosis, prognosis, and POC. PT instructed and educated pt on HEP and she was able to demonstrate and verbally recall with the use of demonstrations and VC. Pt executed therex with proper form and technique with good carry over. Person educated: Patient Education method: Explanation, Demonstration, Verbal cues, and Handouts Education comprehension: returned demonstration  HOME EXERCISE PROGRAM: Access Code: [email or web address] URL:  [email or web address] Date: 01/19/2023 Prepared by: [place] Setting at [place] - 1-2 x weekly - 3 sets - 3-12 reps - Standing Full Range Shoulder Flexion with Dumbbells - 1-2 x weekly - 3 sets - 8-12 reps - Supine Scapular Protraction in Flexion with Dumbbells - 1-2 x weekly - 3 sets - 8-12 reps - Standing Bent Over Shoulder Row with Resistance - 1-2 x weekly - 3 sets - 8-12 reps - Barbell Landmine Press - 1-2 x daily - 7 x weekly - 12-20 reps  ASSESSMENT:  CLINICAL IMPRESSION: PT reassessed goals this session where patient has met all  goals to safely d/c formal PT. Patient is able to demonstrate and verbalize understanding of all HEP recommendations with minimal corrections needed. Pt given clinic contact info should further questions or concerns arise. Pt to d/c PT.    OBJECTIVE IMPAIRMENTS: decreased activity tolerance, decreased coordination, decreased endurance, decreased ROM, decreased strength, increased fascial restrictions, impaired flexibility, impaired UE functional use, improper body mechanics, postural dysfunction, and pain.   ACTIVITY LIMITATIONS: carrying, lifting, sleeping, bathing, dressing, reach over head, hygiene/grooming, and strumming  PARTICIPATION LIMITATIONS:  cleaning and laundry  playing cello  PERSONAL FACTORS:  N/A  REHAB POTENTIAL: Excellent  CLINICAL DECISION MAKING: Stable/uncomplicated  EVALUATION COMPLEXITY: Low   GOALS: Goals reviewed with patient? Yes  SHORT TERM GOALS: Target date: 01/14/23  Pt will execute HEP independently with proper form and technique in order to return to PLOF at home. Baseline: HEP given on 12/17/22 Goal status: INITIAL   LONG TERM GOALS: Target date: 02/11/23  Pt will increase FOTO score to 73 to demonstrate predicted increase in functional mobility to complete ADLs Baseline: 65; 01/19/23: 79 Goal status: MET  2.  Pt will decrease worst pain as reported on NPS by at least 3  points in order to demonstrate clinically significant reduction in pain. Baseline: NPS - 7/10; 01/19/23: NPS - 2-3/10 Goal status: MET  3.  Pt will increase shoulder ABD MMT to 4/5 in order to demonstrate strength needed for ADLs Baseline: 3+/5; 01/19/23: 4/5 Goal status: MET  4.  Pt will increase shoulder AROM WNL to demonstrate ROM needed for ADLs Baseline: 12/21/22 abd: 136d; 01/19/23: 175d Goal status: MET  5.  Pt will increase shoulder AROM WNL to demonstrate ROM needed for ADLs Baseline: 01/19/23: ER: 62d 01/27/23 78d Goal status: MET     PLAN:  PT FREQUENCY: 2x/week  PT DURATION: 8 weeks .  PLANNED INTERVENTIONS: Therapeutic exercises, Therapeutic activity, Neuromuscular re-education, Patient/Family education, Self Care, and Joint mobilization  PLAN FOR NEXT SESSION: Perform the following tests: belly press, ER lag sign, posterior capsule instability test, and Beighton score     Durwin Reges DPT  Physical Therapist- [facility]  01/27/2023, 1:42 PM

## 2023-02-03 ENCOUNTER — Encounter: Payer: Commercial Managed Care - PPO | Admitting: Physical Therapy

## 2023-02-09 ENCOUNTER — Encounter: Payer: Commercial Managed Care - PPO | Admitting: Physical Therapy

## 2023-02-12 ENCOUNTER — Encounter: Payer: Commercial Managed Care - PPO | Admitting: Physical Therapy

## 2023-02-16 ENCOUNTER — Encounter: Payer: Commercial Managed Care - PPO | Admitting: Physical Therapy

## 2023-06-09 ENCOUNTER — Encounter: Payer: BC Managed Care – PPO | Admitting: Family Medicine

## 2023-06-24 ENCOUNTER — Encounter: Payer: Self-pay | Admitting: Family Medicine

## 2023-06-24 ENCOUNTER — Ambulatory Visit (INDEPENDENT_AMBULATORY_CARE_PROVIDER_SITE_OTHER): Payer: Commercial Managed Care - PPO | Admitting: Family Medicine

## 2023-06-24 VITALS — BP 117/82 | HR 64 | Ht 67.5 in | Wt 154.7 lb

## 2023-06-24 DIAGNOSIS — R7309 Other abnormal glucose: Secondary | ICD-10-CM

## 2023-06-24 DIAGNOSIS — Z0000 Encounter for general adult medical examination without abnormal findings: Secondary | ICD-10-CM | POA: Diagnosis not present

## 2023-06-24 DIAGNOSIS — E039 Hypothyroidism, unspecified: Secondary | ICD-10-CM

## 2023-06-24 DIAGNOSIS — Z841 Family history of disorders of kidney and ureter: Secondary | ICD-10-CM | POA: Diagnosis not present

## 2023-06-24 DIAGNOSIS — E782 Mixed hyperlipidemia: Secondary | ICD-10-CM

## 2023-06-24 NOTE — Assessment & Plan Note (Signed)
Declines vaccines  Reports mammogram is scheduled  Things to do to keep yourself healthy  - Exercise at least 30-45 minutes a day, 3-4 days a week.  - Eat a low-fat diet with lots of fruits and vegetables, up to 7-9 servings per day.  - Seatbelts can save your life. Wear them always.  - Smoke detectors on every level of your home, check batteries every year.  - Eye Doctor - have an eye exam every 1-2 years  - Safe sex - if you may be exposed to STDs, use a condom.  - Alcohol -  If you drink, do it moderately, less than 2 drinks per day.  - Health Care Power of Attorney. Choose someone to speak for you if you are not able.  - Depression is common in our stressful world.If you're feeling down or losing interest in things you normally enjoy, please come in for a visit.  - Violence - If anyone is threatening or hurting you, please call immediately.

## 2023-06-24 NOTE — Assessment & Plan Note (Signed)
Repeat LP; recommend diet low in saturated fat and regular exercise - 30 min at least 5 times per week

## 2023-06-24 NOTE — Progress Notes (Signed)
Complete physical exam   Patient: Sherry Hammond   DOB: 11-Jul-1961   62 y.o. Female  MRN: [id number] Visit Date: 06/24/2023  Today's healthcare provider: Jacky Kindle, FNP  Introduced to nurse practitioner role and practice setting.  All questions answered.  Discussed provider/patient relationship and expectations.  Chief Complaint  Patient presents with   Annual Exam   Subjective    Sherry Hammond is a 62 y.o. female who presents today for a complete physical exam.  She reports consuming a general diet. The patient does not participate in regular exercise at present. She generally feels well. She reports sleeping well. She does have additional problems to discuss today.   Reports upcoming mammogram is scheduled; does not do breast self exam at home.   HPI   Past Medical History:  Diagnosis Date   Allergy    Depression    Hyperlipidemia    Thyroid disease    Past Surgical History:  Procedure Laterality Date   CESAREAN SECTION     Social History   Socioeconomic History   Marital status: Married    Spouse name: Loistine Chance   Number of children: 4   Years of education: Bachelor's   Highest education level: Not on file  Occupational History   Occupation: Unemployed  Tobacco Use   Smoking status: Never   Smokeless tobacco: Never  Vaping Use   Vaping status: Never Used  Substance and Sexual Activity   Alcohol use: Yes    Comment: occasional   Drug use: No   Sexual activity: Yes  Other Topics Concern   Not on file  Social History Narrative   Not on file   Social Determinants of Health   Financial Resource Strain: Not on file  Food Insecurity: Not on file  Transportation Needs: Not on file  Physical Activity: Not on file  Stress: Not on file  Social Connections: Not on file  Intimate Partner Violence: Not on file   Family Status  Relation Name Status   Mother  Deceased at age 2       03-26-2019 from urethreal cancer   Father  Alive   Sister   Alive   Brother  Alive   Sister  Alive  No partnership data on file   Family History  Problem Relation Age of Onset   Hyperlipidemia Mother    Hypertension Mother    Cancer Mother        BREAST   Breast cancer Mother 36   Hyperlipidemia Father    Hypertension Father    Kidney disease Father    Healthy Sister    Healthy Brother    Healthy Sister    No Known Allergies  Patient Care Team: Merita Norton T, FNP as PCP - General (Family Medicine)   Medications: Outpatient Medications Prior to Visit  Medication Sig   SYNTHROID 88 MCG tablet TAKE 1 TABLET DAILY   No facility-administered medications prior to visit.      Objective    There were no vitals taken for this visit.  Physical Exam Vitals and nursing note reviewed.  Constitutional:      General: She is awake. She is not in acute distress.    Appearance: Normal appearance. She is well-developed, well-groomed and normal weight. She is not ill-appearing, toxic-appearing or diaphoretic.  HENT:     Head: Normocephalic and atraumatic.     Jaw: There is normal jaw occlusion. No trismus, tenderness, swelling or pain on movement.  Right Ear: Hearing, tympanic membrane, ear canal and external ear normal. There is no impacted cerumen.     Left Ear: Hearing, tympanic membrane, ear canal and external ear normal. There is no impacted cerumen.     Nose: Nose normal. No congestion or rhinorrhea.     Right Turbinates: Not enlarged, swollen or pale.     Left Turbinates: Not enlarged, swollen or pale.     Right Sinus: No maxillary sinus tenderness or frontal sinus tenderness.     Left Sinus: No maxillary sinus tenderness or frontal sinus tenderness.     Mouth/Throat:     Lips: Pink.     Mouth: Mucous membranes are moist. No injury.     Tongue: No lesions.     Pharynx: Oropharynx is clear. Uvula midline. No pharyngeal swelling, oropharyngeal exudate, posterior oropharyngeal erythema or uvula swelling.     Tonsils: No tonsillar  exudate or tonsillar abscesses.  Eyes:     General: Lids are normal. Lids are everted, no foreign bodies appreciated. Vision grossly intact. Gaze aligned appropriately. No allergic shiner or visual field deficit.       Right eye: No discharge.        Left eye: No discharge.     Extraocular Movements: Extraocular movements intact.     Conjunctiva/sclera: Conjunctivae normal.     Right eye: Right conjunctiva is not injected. No exudate.    Left eye: Left conjunctiva is not injected. No exudate.    Pupils: Pupils are equal, round, and reactive to light.  Neck:     Thyroid: No thyroid mass, thyromegaly or thyroid tenderness.     Vascular: No carotid bruit.     Trachea: Trachea normal.  Cardiovascular:     Rate and Rhythm: Normal rate and regular rhythm.     Pulses: Normal pulses.          Carotid pulses are 2+ on the right side and 2+ on the left side.      Radial pulses are 2+ on the right side and 2+ on the left side.       Dorsalis pedis pulses are 2+ on the right side and 2+ on the left side.       Posterior tibial pulses are 2+ on the right side and 2+ on the left side.     Heart sounds: Normal heart sounds, S1 normal and S2 normal. No murmur heard.    No friction rub. No gallop.  Pulmonary:     Effort: Pulmonary effort is normal. No respiratory distress.     Breath sounds: Normal breath sounds and air entry. No stridor. No wheezing, rhonchi or rales.  Chest:     Chest wall: No tenderness.  Abdominal:     General: Abdomen is flat. Bowel sounds are normal. There is no distension.     Palpations: Abdomen is soft. There is no mass.     Tenderness: There is no abdominal tenderness. There is no right CVA tenderness, left CVA tenderness, guarding or rebound.     Hernia: No hernia is present.  Genitourinary:    Comments: Exam deferred; denies complaints Musculoskeletal:        General: No swelling, tenderness, deformity or signs of injury. Normal range of motion.     Cervical back:  Full passive range of motion without pain, normal range of motion and neck supple. No edema, rigidity or tenderness. No muscular tenderness.     Right lower leg: No edema.     Left  lower leg: No edema.  Lymphadenopathy:     Cervical: No cervical adenopathy.     Right cervical: No superficial, deep or posterior cervical adenopathy.    Left cervical: No superficial, deep or posterior cervical adenopathy.  Skin:    General: Skin is warm and dry.     Capillary Refill: Capillary refill takes less than 2 seconds.     Coloration: Skin is not jaundiced or pale.     Findings: No bruising, erythema, lesion or rash.  Neurological:     General: No focal deficit present.     Mental Status: She is alert and oriented to person, place, and time. Mental status is at baseline.     GCS: GCS eye subscore is 4. GCS verbal subscore is 5. GCS motor subscore is 6.     Sensory: Sensation is intact. No sensory deficit.     Motor: Motor function is intact. No weakness.     Coordination: Coordination is intact. Coordination normal.     Gait: Gait is intact. Gait normal.  Psychiatric:        Attention and Perception: Attention and perception normal.        Mood and Affect: Mood and affect normal.        Speech: Speech normal.        Behavior: Behavior normal. Behavior is cooperative.        Thought Content: Thought content normal.        Cognition and Memory: Cognition and memory normal.        Judgment: Judgment normal.     Last depression screening scores    04/09/2022    9:15 AM 11/03/2021    2:30 PM 11/04/2020   10:23 AM  PHQ 2/9 Scores  PHQ - 2 Score 0 0 0  PHQ- 9 Score 0 0 0   Last fall risk screening    04/09/2022    9:13 AM  Fall Risk   Falls in the past year? 0  Number falls in past yr: 0  Injury with Fall? 0  Risk for fall due to : No Fall Risks  Follow up Falls evaluation completed   Last Audit-C alcohol use screening    04/09/2022    9:14 AM  Alcohol Use Disorder Test (AUDIT)  1.  How often do you have a drink containing alcohol? 1  2. How many drinks containing alcohol do you have on a typical day when you are drinking? 0  3. How often do you have six or more drinks on one occasion? 0  AUDIT-C Score 1   A score of 3 or more in women, and 4 or more in men indicates increased risk for alcohol abuse, EXCEPT if all of the points are from question 1   No results found for any visits on 06/24/23.  Assessment & Plan    Routine Health Maintenance and Physical Exam  Exercise Activities and Dietary recommendations  Goals   None     Immunization History  Administered Date(s) Administered   Influenza,inj,Quad PF,6+ Mos 10/05/2014   Td 11/23/2000   Tdap 10/05/2014    Health Maintenance  Topic Date Due   Zoster Vaccines- Shingrix (1 of 2) Never done   MAMMOGRAM  09/10/2020   COVID-19 Vaccine (1 - 2023-24 season) Never done   INFLUENZA VACCINE  02/21/2024 (Originally 06/24/2023)   DTaP/Tdap/Td (3 - Td or Tdap) 10/05/2024   PAP SMEAR-Modifier  11/02/2024   Colonoscopy  02/04/2025   Hepatitis C Screening  Completed   HIV Screening  Completed   HPV VACCINES  Aged Out    Discussed health benefits of physical activity, and encouraged her to engage in regular exercise appropriate for her age and condition.  Problem List Items Addressed This Visit       Endocrine   Hypothyroidism    Chronic, previously stable Maintained on 88 mcg Repeat labs         Other   Annual physical exam - Primary    Declines vaccines  Reports mammogram is scheduled  Things to do to keep yourself healthy  - Exercise at least 30-45 minutes a day, 3-4 days a week.  - Eat a low-fat diet with lots of fruits and vegetables, up to 7-9 servings per day.  - Seatbelts can save your life. Wear them always.  - Smoke detectors on every level of your home, check batteries every year.  - Eye Doctor - have an eye exam every 1-2 years  - Safe sex - if you may be exposed to STDs, use a condom.   - Alcohol -  If you drink, do it moderately, less than 2 drinks per day.  - Health Care Power of Attorney. Choose someone to speak for you if you are not able.  - Depression is common in our stressful world.If you're feeling down or losing interest in things you normally enjoy, please come in for a visit.  - Violence - If anyone is threatening or hurting you, please call immediately.       Relevant Orders   Comprehensive Metabolic Panel (CMET)   TSH   CBC with Differential/Platelet   Lipid panel   Family history of CKD (chronic kidney disease)    Father with CKD nearing dialysis Pt request for [facility]       Relevant Orders   Urine Microalbumin w/creat. ratio   Impaired glucose regulation    Recommend A1c given last two glucose have been elevated Continue to recommend balanced, lower carb meals. Smaller meal size, adding snacks. Choosing water as drink of choice and increasing purposeful exercise.       Relevant Orders   Hemoglobin A1c   Moderate mixed hyperlipidemia not requiring statin therapy    Repeat LP recommend diet low in saturated fat and regular exercise - 30 min at least 5 times per week       Return in about 1 year (around 06/23/2024) for annual examination.    Leilani Merl, FNP, have reviewed all documentation for this visit. The documentation on 06/24/23 for the exam, diagnosis, procedures, and orders are all accurate and complete.  Jacky Kindle, FNP  [facility], [facility] [phone] (phone) [phone] (fax)  [facility]

## 2023-06-24 NOTE — Assessment & Plan Note (Signed)
Recommend A1c given last two glucose have been elevated Continue to recommend balanced, lower carb meals. Smaller meal size, adding snacks. Choosing water as drink of choice and increasing purposeful exercise.

## 2023-06-24 NOTE — Assessment & Plan Note (Signed)
Chronic, previously stable Maintained on 88 mcg Repeat labs

## 2023-06-24 NOTE — Assessment & Plan Note (Signed)
Father with CKD nearing dialysis Pt request for [facility]

## 2023-10-05 ENCOUNTER — Other Ambulatory Visit: Payer: Self-pay | Admitting: Physician Assistant

## 2023-10-05 DIAGNOSIS — E039 Hypothyroidism, unspecified: Secondary | ICD-10-CM

## 2023-10-06 NOTE — Telephone Encounter (Signed)
Requested Prescriptions  Pending Prescriptions Disp Refills   levothyroxine (SYNTHROID) 88 MCG tablet [Pharmacy Med Name: SYNTHROID TAB 88MCG] 90 tablet 0    Sig: TAKE 1 TABLET DAILY     Endocrinology:  Hypothyroid Agents Passed - 10/05/2023  2:09 AM      Passed - TSH in normal range and within 360 days    TSH  Date Value Ref Range Status  06/24/2023 3.390 0.450 - 4.500 uIU/mL Final         Passed - Valid encounter within last 12 months    Recent Outpatient Visits           3 months ago Annual physical exam   [facility] Jacky Kindle, FNP   1 year ago Annual physical exam   [facility] Jacky Kindle, FNP   1 year ago Hypothyroidism, unspecified type   [facility] Jacky Kindle, FNP   2 years ago Annual physical exam   [facility] [place], Alessandra Bevel[place], [place]   3 years ago Encounter for annual physical exam   [facility] [employer], [place], [place]

## 2024-01-03 ENCOUNTER — Telehealth: Payer: Self-pay | Admitting: Family Medicine

## 2024-01-03 DIAGNOSIS — E039 Hypothyroidism, unspecified: Secondary | ICD-10-CM

## 2024-01-03 NOTE — Telephone Encounter (Signed)
[facility] faxed refill request for the following medications:  levothyroxine (SYNTHROID) 88 MCG tablet  Please advise.

## 2024-01-04 MED ORDER — LEVOTHYROXINE SODIUM 88 MCG PO TABS
88.0000 ug | ORAL_TABLET | Freq: Every day | ORAL | 0 refills | Status: DC
Start: 2024-01-04 — End: 2024-02-03

## 2024-01-27 ENCOUNTER — Ambulatory Visit: Payer: Commercial Managed Care - PPO | Admitting: Family Medicine

## 2024-01-27 VITALS — BP 114/82 | HR 62 | Resp 16 | Ht 67.0 in | Wt 145.0 lb

## 2024-01-27 DIAGNOSIS — Z1231 Encounter for screening mammogram for malignant neoplasm of breast: Secondary | ICD-10-CM | POA: Diagnosis not present

## 2024-01-27 DIAGNOSIS — R0989 Other specified symptoms and signs involving the circulatory and respiratory systems: Secondary | ICD-10-CM

## 2024-01-27 DIAGNOSIS — R634 Abnormal weight loss: Secondary | ICD-10-CM | POA: Diagnosis not present

## 2024-01-27 DIAGNOSIS — E039 Hypothyroidism, unspecified: Secondary | ICD-10-CM

## 2024-01-27 DIAGNOSIS — E782 Mixed hyperlipidemia: Secondary | ICD-10-CM

## 2024-01-27 NOTE — Assessment & Plan Note (Addendum)
 Noted since mid-January, associated with decreased appetite.  Recent history of multiple illnesses including flu and norovirus.  No other alarming symptoms such as fever, night sweats, or cough. -Order comprehensive lab work including CBC, CMP, thyroid function, Vitamin D, B12, Iron, and A1c. -Encourage high protein diet and adequate hydration.

## 2024-01-27 NOTE — Assessment & Plan Note (Signed)
Referral for mammo today 

## 2024-01-27 NOTE — Assessment & Plan Note (Signed)
 Most likely had flu two weeks ago as 61 yo son had tested pos for flu.  Symptoms of congestion, ear fullness/popping, and nasal drainage  Denies fevers, SOB, DOB, chills, malaise. Appears to continue to has post flu URI symptoms Denies headaches or sinus pain No tenderness to sinus palpitation Swollen nasal turbinates Unable to visualize TMs d/t wax - rec debrox - pt plans to schedule with her ENT Recommend nasal saline spray and OTC Flonase for eustachian tube fluid and nasal swelling Hot tea with honey for throat irritation Increase water intake Humidification to sinuses

## 2024-01-27 NOTE — Progress Notes (Signed)
 Acute Office Visit  Introduced to nurse practitioner role and practice setting.  All questions answered.  Discussed provider/patient relationship and expectations.   Subjective:     Patient ID: Sherry Hammond, female    DOB: [birth year], 63 y.o.   MRN: [id number]  Chief Complaint  Patient presents with   Weight Loss    Patient comes in today with concerns of losing weight , unintentional.    Sherry Hammond "Sherry Hammond" is a 63 year old female who presents with weight loss and prolonged congestion.  She has been experiencing congestion for the past 13 days, describing it as a 'drowning feeling in my head,' which has left her mostly bedridden. 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 Her ears have felt fluid and full for months, affecting her hearing but without pain. No significant coughing, nausea, vomiting, or recent fevers, although she felt chilled and possibly had a fever about 10-20 days ago. No exposure to tuberculosis or hemoptysis.  Since mid-January, she has experienced weight loss, noting a decrease in appetite. She typically weighs around 150 pounds but is currently around 145 pounds, estimating her weight to be 142 pounds without clothes. She describes her appetite as 'gone' and feels 'appetiteless.'  She has a history of hypothyroidism and is on Synthroid 88 mcg daily.  She is postmenopausal, with her last period occurring about ten years ago.   Review of Systems  All other systems reviewed and are negative.      Objective:    BP 114/82 (BP Location: Left Arm, Patient Position: Sitting, Cuff Size: Normal)   Pulse 62   Resp 16   Ht 5\' 7"  (1.702 m)   Wt 145 lb (65.8 kg)   SpO2 100%   BMI 22.71 kg/m    Physical Exam Vitals reviewed.  Constitutional:      General: She is not in acute distress.    Appearance: Normal appearance. She is normal weight. She is not  ill-appearing, toxic-appearing or diaphoretic.  HENT:     Right Ear: Ear canal and external ear normal. There is impacted cerumen.     Left Ear: Ear canal and external ear normal. There is impacted cerumen.     Ears:     Comments: Unable to visualize TM d/t impacted dry cerumen    Nose: Congestion and rhinorrhea present. No nasal tenderness.     Right Turbinates: Enlarged and swollen.     Left Turbinates: Enlarged and swollen.     Right Sinus: No maxillary sinus tenderness.     Left Sinus: No maxillary sinus tenderness or frontal sinus tenderness.     Mouth/Throat:     Mouth: Mucous membranes are moist.     Pharynx: Oropharynx is clear. Posterior oropharyngeal erythema present.     Tonsils: No tonsillar exudate or tonsillar abscesses.  Eyes:     General:        Right eye: No discharge.        Left eye: No discharge.     Extraocular Movements: Extraocular movements intact.  Neck:     Thyroid: No thyroid mass, thyromegaly or thyroid tenderness.     Vascular: No carotid bruit.  Cardiovascular:     Rate and Rhythm: Normal rate and regular rhythm.     Pulses: Normal pulses.     Heart sounds: Normal heart sounds. No murmur heard.    No friction rub. No gallop.  Pulmonary:     Effort: Pulmonary effort is normal. No respiratory distress.     Breath sounds: Normal breath sounds. No stridor. No wheezing, rhonchi or rales.  Chest:     Chest wall: No tenderness.  Abdominal:     General: Abdomen is flat. Bowel sounds are normal. There is no distension.     Palpations: Abdomen is soft. There is no mass.     Tenderness: There is no abdominal tenderness. There is no right CVA tenderness, left CVA tenderness, guarding or rebound.     Hernia: No hernia is present.  Musculoskeletal:        General: Normal range of motion.  Lymphadenopathy:     Cervical: No cervical adenopathy.     Right cervical: No superficial, deep or posterior cervical adenopathy.    Left cervical: No superficial, deep or  posterior cervical adenopathy.  Skin:    General: Skin is warm and dry.     Capillary Refill: Capillary refill takes less than 2 seconds.  Neurological:     General: No focal deficit present.     Mental Status: She is alert and oriented to person, place, and time. Mental status is at baseline.  Psychiatric:        Mood and Affect: Mood normal.        Behavior: Behavior normal.        Thought Content: Thought content normal.        Judgment: Judgment normal.     No results found for any visits on 01/27/24.     Assessment & Plan:   Problem List Items Addressed This Visit       Endocrine   Hypothyroidism   Chronic Continue Synthroid Check TSH today given weight loss today      Relevant Orders   TSH + free T4     Other   Moderate mixed hyperlipidemia not requiring statin therapy   Pt fasting today Will recheck Lipid panel Continue low saturated fat diet      Relevant Orders   Lipid panel   Weight loss, unintentional - Primary   Noted since mid-January, associated with decreased appetite.  Recent history of multiple illnesses including flu and norovirus.  No other alarming symptoms such as fever, night sweats, or cough. -Order comprehensive lab work including CBC, CMP, thyroid function, Vitamin D, B12, Iron, and A1c. -Encourage high protein diet and adequate hydration.      Relevant Orders   CBC with Differential/Platelet   Comprehensive metabolic panel   Hemoglobin A1c   Vitamin B12   VITAMIN D 25 Hydroxy (Vit-D Deficiency, Fractures)   Iron, TIBC and Ferritin Panel   Screening mammogram for breast cancer   Referral for mammo today      Relevant Orders   MM 3D SCREENING MAMMOGRAM BILATERAL BREAST   Symptoms of upper respiratory infection (URI)   Most likely had flu two weeks ago as 33 yo son had tested pos for flu.  Symptoms of congestion, ear fullness/popping, and nasal drainage  Denies fevers, SOB, DOB, chills, malaise. Appears to continue to has  post flu URI symptoms Denies headaches or sinus pain No tenderness to sinus palpitation Swollen nasal turbinates Unable to visualize TMs d/t wax - rec debrox - pt plans to schedule with her ENT Recommend nasal saline spray and OTC Flonase for eustachian tube fluid and nasal swelling Hot tea with honey for throat irritation Increase water intake Humidification to sinuses       No orders  of the defined types were placed in this encounter.  Will communicate lab results  Return in about 3 months (around 04/28/2024) for annual physical.  I, Sallee Provencal, FNP, have reviewed all documentation for this visit. The documentation on 01/27/24 for the exam, diagnosis, procedures, and orders are all accurate and complete.   Sallee Provencal, FNP

## 2024-01-27 NOTE — Assessment & Plan Note (Signed)
 Pt fasting today Will recheck Lipid panel Continue low saturated fat diet

## 2024-01-27 NOTE — Assessment & Plan Note (Signed)
 Chronic Continue Synthroid Check TSH today given weight loss today

## 2024-01-28 LAB — CBC WITH DIFFERENTIAL/PLATELET
Basophils Absolute: 0.1 10*3/uL (ref 0.0–0.2)
Basos: 1 %
EOS (ABSOLUTE): 0.1 10*3/uL (ref 0.0–0.4)
Eos: 2 %
Hematocrit: 39.6 % (ref 34.0–46.6)
Hemoglobin: 13.3 g/dL (ref 11.1–15.9)
Immature Grans (Abs): 0 10*3/uL (ref 0.0–0.1)
Immature Granulocytes: 0 %
Lymphocytes Absolute: 2.1 10*3/uL (ref 0.7–3.1)
Lymphs: 37 %
MCH: 30.1 pg (ref 26.6–33.0)
MCHC: 33.6 g/dL (ref 31.5–35.7)
MCV: 90 fL (ref 79–97)
Monocytes Absolute: 0.5 10*3/uL (ref 0.1–0.9)
Monocytes: 8 %
Neutrophils Absolute: 3 10*3/uL (ref 1.4–7.0)
Neutrophils: 52 %
Platelets: 356 10*3/uL (ref 150–450)
RBC: 4.42 x10E6/uL (ref 3.77–5.28)
RDW: 11.8 % (ref 11.7–15.4)
WBC: 5.7 10*3/uL (ref 3.4–10.8)

## 2024-01-28 LAB — COMPREHENSIVE METABOLIC PANEL
ALT: 12 IU/L (ref 0–32)
AST: 20 IU/L (ref 0–40)
Albumin: 4.6 g/dL (ref 3.8–4.9)
Alkaline Phosphatase: 58 IU/L (ref 44–121)
BUN/Creatinine Ratio: 13 (ref 12–28)
BUN: 11 mg/dL (ref 8–27)
Bilirubin Total: 0.7 mg/dL (ref 0.0–1.2)
CO2: 24 mmol/L (ref 20–29)
Calcium: 9.4 mg/dL (ref 8.7–10.3)
Chloride: 103 mmol/L (ref 96–106)
Creatinine, Ser: 0.86 mg/dL (ref 0.57–1.00)
Globulin, Total: 2.4 g/dL (ref 1.5–4.5)
Glucose: 89 mg/dL (ref 70–99)
Potassium: 4.1 mmol/L (ref 3.5–5.2)
Sodium: 138 mmol/L (ref 134–144)
Total Protein: 7 g/dL (ref 6.0–8.5)
eGFR: 77 mL/min/{1.73_m2} (ref >59)

## 2024-01-28 LAB — HEMOGLOBIN A1C
Est. average glucose Bld gHb Est-mCnc: 117 mg/dL
Hgb A1c MFr Bld: 5.7 % — ABNORMAL HIGH (ref 4.8–5.6)

## 2024-01-28 LAB — VITAMIN D 25 HYDROXY (VIT D DEFICIENCY, FRACTURES): Vit D, 25-Hydroxy: 24.9 ng/mL — ABNORMAL LOW (ref 30.0–100.0)

## 2024-01-28 LAB — VITAMIN B12: Vitamin B-12: 896 pg/mL (ref 232–1245)

## 2024-01-28 LAB — TSH+FREE T4
Free T4: 1.49 ng/dL (ref 0.82–1.77)
TSH: 2.52 u[IU]/mL (ref 0.450–4.500)

## 2024-01-28 LAB — LIPID PANEL
Chol/HDL Ratio: 4.2 ratio (ref 0.0–4.4)
Cholesterol, Total: 228 mg/dL — ABNORMAL HIGH (ref 100–199)
HDL: 54 mg/dL (ref >39)
LDL Chol Calc (NIH): 158 mg/dL — ABNORMAL HIGH (ref 0–99)
Triglycerides: 93 mg/dL (ref 0–149)
VLDL Cholesterol Cal: 16 mg/dL (ref 5–40)

## 2024-01-28 LAB — IRON,TIBC AND FERRITIN PANEL
Ferritin: 319 ng/mL — ABNORMAL HIGH (ref 15–150)
Iron Saturation: 35 % (ref 15–55)
Iron: 101 ug/dL (ref 27–159)
Total Iron Binding Capacity: 288 ug/dL (ref 250–450)
UIBC: 187 ug/dL (ref 131–425)

## 2024-02-02 ENCOUNTER — Other Ambulatory Visit: Payer: Self-pay | Admitting: Family Medicine

## 2024-02-02 DIAGNOSIS — E039 Hypothyroidism, unspecified: Secondary | ICD-10-CM

## 2024-02-25 ENCOUNTER — Other Ambulatory Visit: Payer: Self-pay | Admitting: Family Medicine

## 2024-02-25 DIAGNOSIS — E039 Hypothyroidism, unspecified: Secondary | ICD-10-CM

## 2024-03-26 ENCOUNTER — Other Ambulatory Visit: Payer: Self-pay | Admitting: Family Medicine

## 2024-03-26 DIAGNOSIS — E039 Hypothyroidism, unspecified: Secondary | ICD-10-CM

## 2024-04-25 ENCOUNTER — Other Ambulatory Visit: Payer: Self-pay | Admitting: Family Medicine

## 2024-04-25 DIAGNOSIS — E039 Hypothyroidism, unspecified: Secondary | ICD-10-CM

## 2024-04-25 NOTE — Telephone Encounter (Signed)
 Requested Prescriptions  Pending Prescriptions Disp Refills   levothyroxine  (SYNTHROID ) 88 MCG tablet [Pharmacy Med Name: SYNTHROID  TAB 88MCG] 90 tablet 1    Sig: TAKE 1 TABLET DAILY     Endocrinology:  Hypothyroid Agents Failed - 04/25/2024  5:41 PM      Failed - Valid encounter within last 12 months    Recent Outpatient Visits           2 months ago Weight loss, unintentional   [facility] [place], Ronny Colas, FNP       Future Appointments             In 3 weeks Tasia Farr, FNP [place] [facility]            Passed - TSH in normal range and within 360 days    TSH  Date Value Ref Range Status  01/27/2024 2.520 0.450 - 4.500 uIU/mL Final

## 2024-05-01 ENCOUNTER — Encounter: Admitting: Family Medicine

## 2024-05-18 ENCOUNTER — Encounter: Admitting: Family Medicine

## 2024-06-19 ENCOUNTER — Encounter: Payer: Self-pay | Admitting: Family Medicine

## 2024-06-19 ENCOUNTER — Other Ambulatory Visit: Payer: Self-pay

## 2024-06-19 DIAGNOSIS — Z1231 Encounter for screening mammogram for malignant neoplasm of breast: Secondary | ICD-10-CM

## 2024-06-26 ENCOUNTER — Encounter: Admitting: Family Medicine

## 2024-06-28 ENCOUNTER — Ambulatory Visit: Payer: Self-pay | Admitting: Family Medicine

## 2024-06-28 ENCOUNTER — Encounter: Payer: Self-pay | Admitting: Family Medicine

## 2024-06-28 LAB — HM MAMMOGRAPHY

## 2024-07-19 ENCOUNTER — Encounter: Admitting: Family Medicine

## 2024-08-02 ENCOUNTER — Encounter: Payer: Self-pay | Admitting: Family Medicine

## 2024-08-02 ENCOUNTER — Ambulatory Visit (INDEPENDENT_AMBULATORY_CARE_PROVIDER_SITE_OTHER): Admitting: Family Medicine

## 2024-08-02 VITALS — BP 116/80 | HR 67 | Temp 97.9°F | Ht 68.0 in | Wt 146.7 lb

## 2024-08-02 DIAGNOSIS — E039 Hypothyroidism, unspecified: Secondary | ICD-10-CM

## 2024-08-02 DIAGNOSIS — Z0001 Encounter for general adult medical examination with abnormal findings: Secondary | ICD-10-CM | POA: Diagnosis not present

## 2024-08-02 DIAGNOSIS — R7303 Prediabetes: Secondary | ICD-10-CM

## 2024-08-02 DIAGNOSIS — Z2821 Immunization not carried out because of patient refusal: Secondary | ICD-10-CM

## 2024-08-02 DIAGNOSIS — E559 Vitamin D deficiency, unspecified: Secondary | ICD-10-CM

## 2024-08-02 DIAGNOSIS — E782 Mixed hyperlipidemia: Secondary | ICD-10-CM

## 2024-08-02 DIAGNOSIS — Z0000 Encounter for general adult medical examination without abnormal findings: Secondary | ICD-10-CM

## 2024-08-02 NOTE — Progress Notes (Signed)
 Complete physical exam  Patient: Sherry Hammond   DOB: 18-Jun-1961   63 y.o. Female  MRN: [id number]  Introduced to nurse practitioner role and practice setting.  All questions answered.  Discussed provider/patient relationship and expectations.   Subjective:    Chief Complaint  Patient presents with   Annual Exam    Diet- General Exercise- No Overall feeling- Very good Sleep- Very well Concerns- None  All vaccines declined.    Sherry Hammond is a 63 y.o. female who presents today for a complete physical exam. She reports consuming a general diet. The patient does not participate in regular exercise at present. She generally feels well. She reports sleeping well. She does not have additional problems to discuss today.   Discussed the use of AI scribe software for clinical note transcription with the patient, who gave verbal consent to proceed.  History of Present Illness Sherry Hammond is a 63 year old female who presents for a follow-up visit regarding weight management and cholesterol levels.  She has experienced weight loss over the past year, initially attributing it to stress from her daughter and granddaughter living with her. Her appetite returned after they moved out, and she has since gained a pound, currently weighing 146 pounds, down from 154 pounds a year ago.  She is concerned about her cholesterol levels, noting that her total cholesterol remains elevated despite some improvement. Her LDL cholesterol has increased over the years, although her HDL cholesterol was previously high.  She is aware of her prediabetic status, with an A1c of 5.7% for the last two tests. She attributes this to dietary habits, including intermittent fasting and consuming one meal a day, and is concerned about sugar intake. No family history of diabetes is reported.  She mentions a history of low vitamin D  levels, with a recent level of 24 ng/mL, and acknowledges not taking  supplements regularly. She also reports inconsistent use of her thyroxine medication due to misplacing it during a vacation.  She discusses her hydration habits, noting she drinks primarily water, consuming about 10 cups a day. She mentions a history of lithium use 20 years ago, which led to increased water intake. She reports feeling dry and thirsty at times but attributes it to age.  She has a history of kidney function concerns, which improved over the past year.  She reports wearing glasses and having her eyes checked less than two years ago. She also mentions a recent ear infection treated with antibiotics and a history of auditory check-ups. She has not visited a dentist recently but acknowledges the importance of regular dental care.     Most recent fall risk assessment:    08/02/2024   10:36 AM  Fall Risk   Falls in the past year? 0  Number falls in past yr: 0  Injury with Fall? 0  Risk for fall due to : No Fall Risks     Most recent depression screenings:    08/02/2024   10:36 AM 01/27/2024    2:44 PM  PHQ 2/9 Scores  PHQ - 2 Score 0 0  PHQ- 9 Score 0     Vision:Within last year and Dental: No current dental problems and No regular dental care   Patient Active Problem List   Diagnosis Date Noted   Prediabetes 08/02/2024   Weight loss, unintentional 01/27/2024   Screening mammogram for breast cancer 01/27/2024   Symptoms of upper respiratory infection (URI) 01/27/2024   Impaired glucose regulation  06/24/2023   Family history of CKD (chronic kidney disease) 06/24/2023   Moderate mixed hyperlipidemia not requiring statin therapy 04/09/2022   Hypothyroidism 11/03/2021   Vitamin D  deficiency 01/24/2009   Past Medical History:  Diagnosis Date   Allergy 2000   occasional pollen   Depression    Hyperlipidemia    Thyroid disease    Past Surgical History:  Procedure Laterality Date   CESAREAN SECTION  04/15/1994   Social History   Tobacco Use   Smoking status:  Never   Smokeless tobacco: Never  Vaping Use   Vaping status: Never Used  Substance Use Topics   Alcohol use: Yes    Comment: occasional   Drug use: No   No Known Allergies    Patient Care Team: Wellington Curtis LABOR, FNP as PCP - General (Family Medicine)   Outpatient Medications Prior to Visit  Medication Sig   levothyroxine  (SYNTHROID ) 88 MCG tablet TAKE 1 TABLET DAILY   No facility-administered medications prior to visit.    ROS        Objective:     BP 116/80 (BP Location: Left Arm, Patient Position: Sitting, Cuff Size: Normal)   Pulse 67   Temp 97.9 F (36.6 C) (Oral)   Ht 5' 8 (1.727 m)   Wt 146 lb 11.2 oz (66.5 kg)   SpO2 100%   BMI 22.31 kg/m  BP Readings from Last 3 Encounters:  08/02/24 116/80  01/27/24 114/82  06/24/23 117/82   Wt Readings from Last 3 Encounters:  08/02/24 146 lb 11.2 oz (66.5 kg)  01/27/24 145 lb (65.8 kg)  06/24/23 154 lb 11.2 oz (70.2 kg)      Physical Exam Constitutional:      General: She is not in acute distress.    Appearance: Normal appearance. She is normal weight. She is not ill-appearing.  HENT:     Head: Normocephalic.     Right Ear: Hearing, tympanic membrane, ear canal and external ear normal.     Left Ear: Hearing, tympanic membrane, ear canal and external ear normal.     Nose: Nose normal. No congestion or rhinorrhea.     Mouth/Throat:     Mouth: Mucous membranes are moist.     Pharynx: Oropharynx is clear. No oropharyngeal exudate or posterior oropharyngeal erythema.  Eyes:     General: Lids are normal.        Right eye: No discharge.        Left eye: No discharge.     Extraocular Movements: Extraocular movements intact.     Right eye: Normal extraocular motion.     Left eye: Normal extraocular motion.     Conjunctiva/sclera: Conjunctivae normal.     Right eye: Right conjunctiva is not injected.     Left eye: Left conjunctiva is not injected.     Pupils: Pupils are equal, round, and reactive to light.   Neck:     Thyroid: No thyroid mass, thyromegaly or thyroid tenderness.     Vascular: No carotid bruit.  Cardiovascular:     Rate and Rhythm: Normal rate and regular rhythm.     Pulses: Normal pulses.          Radial pulses are 2+ on the right side and 2+ on the left side.       Posterior tibial pulses are 2+ on the right side and 2+ on the left side.     Heart sounds: Normal heart sounds, S1 normal and S2 normal. No murmur  heard.    No friction rub. No gallop.  Pulmonary:     Effort: Pulmonary effort is normal. No respiratory distress.     Breath sounds: Normal breath sounds. No stridor. No wheezing, rhonchi or rales.  Chest:     Comments: Not assessed, UTD mammo - no concerns Abdominal:     General: Bowel sounds are normal. There is no distension.     Palpations: Abdomen is soft. There is no mass.     Tenderness: There is no abdominal tenderness. There is no right CVA tenderness, left CVA tenderness, guarding or rebound.     Hernia: No hernia is present.  Genitourinary:    Comments: Not assessed, no concerns, PAP UTD Musculoskeletal:        General: No swelling or tenderness. Normal range of motion.     Cervical back: Normal range of motion. No rigidity or tenderness.  Lymphadenopathy:     Cervical: No cervical adenopathy.     Right cervical: No superficial, deep or posterior cervical adenopathy.    Left cervical: No superficial, deep or posterior cervical adenopathy.  Skin:    General: Skin is warm and dry.     Capillary Refill: Capillary refill takes less than 2 seconds.     Findings: No bruising or erythema.  Neurological:     General: No focal deficit present.     Mental Status: She is alert and oriented to person, place, and time. Mental status is at baseline.     GCS: GCS eye subscore is 4. GCS verbal subscore is 5. GCS motor subscore is 6.     Cranial Nerves: No cranial nerve deficit.     Sensory: No sensory deficit.     Motor: No weakness, tremor or pronator drift.      Coordination: Romberg sign negative. Coordination normal. Finger-Nose-Finger Test and Heel to [facility] Test normal.     Gait: Gait is intact. Gait normal.  Psychiatric:        Attention and Perception: Attention and perception normal.        Mood and Affect: Mood and affect normal.        Speech: Speech normal.        Behavior: Behavior normal. Behavior is cooperative.        Thought Content: Thought content normal.        Cognition and Memory: Cognition and memory normal.        Judgment: Judgment normal.      No results found for any visits on 08/02/24. Last CBC Lab Results  Component Value Date   WBC 5.7 01/27/2024   HGB 13.3 01/27/2024   HCT 39.6 01/27/2024   MCV 90 01/27/2024   MCH 30.1 01/27/2024   RDW 11.8 01/27/2024   PLT 356 01/27/2024   Last metabolic panel Lab Results  Component Value Date   GLUCOSE 89 01/27/2024   NA 138 01/27/2024   K 4.1 01/27/2024   CL 103 01/27/2024   CO2 24 01/27/2024   BUN 11 01/27/2024   CREATININE 0.86 01/27/2024   EGFR 77 01/27/2024   CALCIUM 9.4 01/27/2024   PROT 7.0 01/27/2024   ALBUMIN 4.6 01/27/2024   LABGLOB 2.4 01/27/2024   AGRATIO 2.0 04/09/2022   BILITOT 0.7 01/27/2024   ALKPHOS 58 01/27/2024   AST 20 01/27/2024   ALT 12 01/27/2024   Last lipids Lab Results  Component Value Date   CHOL 228 (H) 01/27/2024   HDL 54 01/27/2024   LDLCALC 158 (H) 01/27/2024  TRIG 93 01/27/2024   CHOLHDL 4.2 01/27/2024   Last hemoglobin A1c Lab Results  Component Value Date   HGBA1C 5.7 (H) 01/27/2024   Last thyroid functions Lab Results  Component Value Date   TSH 2.520 01/27/2024   Last vitamin D  Lab Results  Component Value Date   VD25OH 24.9 (L) 01/27/2024        Assessment & Plan:    Routine Health Maintenance and Physical Exam  Assessment and Plan Assessment & Plan Annual Physical Examination  Things to do to keep yourself healthy  - Exercise at least 30-45 minutes a day, 3-4 days a week.  - Eat a low-fat  diet with lots of fruits and vegetables, up to 7-9 servings per day.  - Seatbelts can save your life. Wear them always.  - Smoke detectors on every level of your home, check batteries every year.  - Eye Doctor - have an eye exam every 1-2 years  - Safe sex - if you may be exposed to STDs, use a condom.  - Alcohol -  If you drink, do it moderately, less than 2 drinks per day.  - Health Care Power of Attorney. Choose someone to speak for you if you are not able.  - Depression is common in our stressful world.If you're feeling down or losing interest in things you normally enjoy, please come in for a visit.  - Violence - If anyone is threatening or hurting you, please call immediately.  Screening Maintenance - Due for Tdap - December 2025 - UTD on Mammo - next due August 2026 - Due for Colonoscopy March 2026 - Due for PAP December 2025  Declined Immunization - declined flu, COVID, pneumococcal, and shingles  Mixed hyperlipidemia Total cholesterol remains elevated with improvement from previous levels. LDL cholesterol is elevated, but cardiovascular risk is low at 3.1% over 10 years. Genetic factors may contribute to lipid levels. - Continue lifestyle management for cholesterol control. - Consider omega-3 supplements or fish oil to improve lipid profile. - Limit intake of dairy and red meats. - Encourage consumption of healthy fats such as nuts, avocados, and fish.  The 10-year ASCVD risk score (Arnett DK, et al., 2019) is: 3.1%   Values used to calculate the score:     Age: 40 years     Clincally relevant sex: Female     Is Non-Hispanic African American: No     Diabetic: No     Tobacco smoker: No     Systolic Blood Pressure: 116 mmHg     Is BP treated: No     HDL Cholesterol: 54 mg/dL     Total Cholesterol: 228 mg/dL  Prediabetes Sherry Hammond is 4.2% -March 2025. No family history of diabetes. Current dietary habits include intermittent fasting with one meal a day, which may not be optimal  for metabolism. - Consider small, frequent meals or snacks to improve metabolism. - Increase protein intake with snacks such as apples and peanut butter or [place]. - Encourage regular exercise and hydration. - pt would like to hold on labs today until follow up appointment  Hypothyroidism Inconsistent with taking thyroxine due to vacation, potentially leading to suboptimal thyroid function. - Resume levothyroxine  daily - Repeat TSH at next visit  Vitamin D  deficiency - OTC vitamin D  supplementation, 1000 to 3000 ius.   Health Maintenance  Topic Date Due   Pneumococcal Vaccine for age over 36 (1 of 1 - PCV) Never done   Zoster (Shingles) Vaccine (1 of 2)  Never done   COVID-19 Vaccine (1 - 2024-25 season) Never done   Flu Shot  02/20/2025*   DTaP/Tdap/Td vaccine (3 - Td or Tdap) 10/05/2024   Pap with HPV screening  11/02/2024   Colon Cancer Screening  02/04/2025   Mammogram  06/28/2025   Hepatitis C Screening  Completed   HIV Screening  Completed   Hepatitis B Vaccine  Aged Out   HPV Vaccine  Aged Out   Meningitis B Vaccine  Aged Out  *Topic was postponed. The date shown is not the original due date.    Discussed health benefits of physical activity, and encouraged her to engage in regular exercise appropriate for her age and condition.  Annual physical exam  Moderate mixed hyperlipidemia not requiring statin therapy  Hypothyroidism, unspecified type  Immunization declined  Prediabetes  Vitamin D  deficiency     Return in about 6 months (around 01/30/2025) for Chronic Disease .   I, Curtis DELENA Boom, FNP, have reviewed all documentation for this visit. The documentation on 08/02/24 for the exam, diagnosis, procedures, and orders are all accurate and complete.   Curtis DELENA Boom, FNP

## 2024-10-23 ENCOUNTER — Telehealth: Payer: Self-pay | Admitting: Family Medicine

## 2024-10-23 ENCOUNTER — Other Ambulatory Visit: Payer: Self-pay

## 2024-10-23 DIAGNOSIS — E039 Hypothyroidism, unspecified: Secondary | ICD-10-CM

## 2024-10-23 MED ORDER — LEVOTHYROXINE SODIUM 88 MCG PO TABS: 88.0000 ug | ORAL_TABLET | Freq: Every day | ORAL | 0 refills | Status: AC

## 2024-10-23 NOTE — Telephone Encounter (Signed)
[facility] faxed refill request for the following medications:  levothyroxine (SYNTHROID) 88 MCG tablet  Please advise.

## 2024-10-23 NOTE — Telephone Encounter (Signed)
 Converted to rx refill

## 2024-10-25 ENCOUNTER — Emergency Department: Admission: EM | Admit: 2024-10-25 | Discharge: 2024-10-25 | Disposition: A | Discharge status: Home / Self Care

## 2024-10-25 ENCOUNTER — Other Ambulatory Visit: Payer: Self-pay

## 2024-10-25 ENCOUNTER — Encounter: Payer: Self-pay | Admitting: Emergency Medicine

## 2024-10-25 DIAGNOSIS — Y93D1 Activity, knitting and crocheting: Secondary | ICD-10-CM | POA: Diagnosis not present

## 2024-10-25 DIAGNOSIS — W268XXA Contact with other sharp object(s), not elsewhere classified, initial encounter: Secondary | ICD-10-CM | POA: Insufficient documentation

## 2024-10-25 DIAGNOSIS — S61211A Laceration without foreign body of left index finger without damage to nail, initial encounter: Secondary | ICD-10-CM

## 2024-10-25 DIAGNOSIS — S61311A Laceration without foreign body of left index finger with damage to nail, initial encounter: Secondary | ICD-10-CM | POA: Diagnosis not present

## 2024-10-25 DIAGNOSIS — S6992XA Unspecified injury of left wrist, hand and finger(s), initial encounter: Secondary | ICD-10-CM | POA: Diagnosis present

## 2024-10-25 MED ORDER — LIDOCAINE-EPINEPHRINE-TETRACAINE (LET) TOPICAL GEL
3.0000 mL | Freq: Once | TOPICAL | Status: AC
  Administered 2024-10-25: 3 mL via TOPICAL
  Filled 2024-10-25: qty 3

## 2024-10-25 MED ORDER — LIDOCAINE HCL (PF) 1 % IJ SOLN
5.0000 mL | Freq: Once | INTRAMUSCULAR | Status: AC
  Administered 2024-10-25: 5 mL
  Filled 2024-10-25: qty 5

## 2024-10-25 NOTE — Discharge Instructions (Signed)
 If you have skin tapes (Steristrips) or skin glue (Dermabond) on your incision, leave them in place. They will fall off on their own like a scab in a few weeks.  You may trim any edges that curl up with clean scissors.   Pain control:  Ibuprofen (motrin/aleve/advil) - You can take 3 tablets (600 mg) every 6 hours as needed for pain.  Acetaminophen (tylenol) - You can take 2 extra strength tablets (1000 mg) every 6 hours as needed for pain.  You can alternate these medications or take them together.  Make sure you eat food/drink water when taking these medications.  It was a pleasure treating you.  Return to ED for any new or worsening symptoms.  Please monitor for infection such as fever, redness, drainage and severe pain to the area.  You have opted to get your tetanus with your primary care provider.  Please ensure this is updated.  Keep finger splint in place for at least 1 week.

## 2024-10-25 NOTE — ED Triage Notes (Signed)
 Patient states she was knitting and cut the tip of her pointer finger. Bleeding still. Pressure dressing applied. No blood thinners. Unsure of last tetanus.

## 2024-10-25 NOTE — ED Provider Notes (Signed)
 [facility] Emergency Department Provider Note     Event Date/Time   First MD Initiated Contact with Patient 10/25/24 1709     (approximate)   History   finger laceration   HPI  Sherry Hammond is a 63 y.o. female presents to the ED for evaluation finger laceration to left index finger.  Patient reports she was knitting and instantly sliced the tip of her finger off.  Reports immediate pain and bleeding that she is unable to control prior to ED arrival.  She denies blood thinners.  Unknown tetanus status.     Physical Exam   Triage Vital Signs: ED Triage Vitals  Encounter Vitals Group     BP 10/25/24 1706 (!) 148/105     Girls Systolic BP Percentile --      Girls Diastolic BP Percentile --      Boys Systolic BP Percentile --      Boys Diastolic BP Percentile --      Pulse Rate 10/25/24 1706 83     Resp 10/25/24 1706 20     Temp 10/25/24 1738 98.7 F (37.1 C)     Temp Source 10/25/24 1738 Oral     SpO2 10/25/24 1706 97 %     Weight 10/25/24 1705 145 lb (65.8 kg)     Height 10/25/24 1705 5' 8 (1.727 m)     Head Circumference --      Peak Flow --      Pain Score 10/25/24 1705 5     Pain Loc --      Pain Education --      Exclude from Growth Chart --     Most recent vital signs: Vitals:   10/25/24 1706 10/25/24 1738  BP: (!) 148/105   Pulse: 83   Resp: 20   Temp:  98.7 F (37.1 C)  SpO2: 97%     General Awake, no distress.  HEENT NCAT. CV:  Good peripheral perfusion.  RESP:  Normal effort.  ABD:  No distention.  Other:  Avulsed fingertip of the left index finger with partial tip of nail involvement.  No bone exposed.   ED Results / Procedures / Treatments   Labs (all labs ordered are listed, but only abnormal results are displayed) Labs Reviewed - No data to display  No results found.  PROCEDURES:  Critical Care performed: No  .Laceration Repair  Date/Time: 10/25/2024 10:52 PM  Performed by: Margrette Monte A,  PA-C Authorized by: Margrette Monte A, PA-C   Consent:    Consent obtained:  Verbal   Consent given by:  Patient   Risks discussed:  Infection, pain, poor cosmetic result, poor wound healing, nerve damage and need for additional repair Universal protocol:    Patient identity confirmed:  Verbally with patient Anesthesia:    Anesthesia method:  Topical application and local infiltration   Topical anesthetic:  LET   Local anesthetic:  Lidocaine 1% w/o epi Laceration details:    Location:  Finger   Finger location:  L index finger   Length (cm):  1   Depth (mm):  0.5 Exploration:    Hemostasis achieved with:  LET and tourniquet   Wound exploration: wound explored through full range of motion and entire depth of wound visualized     Contaminated: no   Treatment:    Area cleansed with:  Povidone-iodine, saline and chlorhexidine   Amount of cleaning:  Standard   Irrigation solution:  Sterile saline Skin  repair:    Repair method:  Tissue adhesive Repair type:    Repair type:  Simple Post-procedure details:    Dressing:  Bulky dressing and splint for protection   Procedure completion:  Tolerated    MEDICATIONS ORDERED IN ED: Medications  lidocaine-EPINEPHrine-tetracaine (LET) topical gel (3 mLs Topical Given 10/25/24 1930)  lidocaine (PF) (XYLOCAINE) 1 % injection 5 mL (5 mLs Infiltration Given 10/25/24 1931)     IMPRESSION / MDM / ASSESSMENT AND PLAN / ED COURSE  I reviewed the triage vital signs and the nursing notes.                               64 y.o. female presents to the emergency department for evaluation and treatment of finger laceration. See HPI for further details.   Differential diagnosis includes, but is not limited to laceration, amputation, fingertip avulsion  Patient's presentation is most consistent with acute complicated illness / injury requiring diagnostic workup.  Patient alert and oriented.  Pressure dressing applied over left index finger.  When  removed pulsatile vasculature can be shown from avulsed finger.  Multiple attempts to control bleeding were had and successful with LET use.  Please see procedure note.  No laceration repair as there is no skin to approximate the injury.  Injury will have to heal by secondary granulation.  Dermabond glue applied to tip of finger.  Finger splint placed.  Tetanus was offered to patient however she would like to obtain this from her primary care provider.  The patient is in stable condition for discharge home at this time.  ED return precaution discussed.  FINAL CLINICAL IMPRESSION(S) / ED DIAGNOSES   Final diagnoses:  Laceration of left index finger without foreign body, nail damage status unspecified, initial encounter   Rx / DC Orders   ED Discharge Orders     None        Note:  This document was prepared using Dragon voice recognition software and may include unintentional dictation errors.    Guthridge, Dang Mathison A, PA-C 10/25/24 2253    Clarine Ozell LABOR, MD 10/25/24 [phone]

## 2025-01-30 ENCOUNTER — Ambulatory Visit: Admitting: Family Medicine
